# Patient Record
Sex: Female | Born: 1935 | Race: White | Hispanic: No | Smoking: Never smoker
Health system: Southern US, Community
[De-identification: ages and names within clinical notes are randomized; demographics above are authoritative.]

## PROBLEM LIST (undated history)

## (undated) DIAGNOSIS — I10 Essential (primary) hypertension: Secondary | ICD-10-CM

## (undated) DIAGNOSIS — R42 Dizziness and giddiness: Secondary | ICD-10-CM

## (undated) DIAGNOSIS — E7800 Pure hypercholesterolemia, unspecified: Secondary | ICD-10-CM

## (undated) DIAGNOSIS — G459 Transient cerebral ischemic attack, unspecified: Secondary | ICD-10-CM

## (undated) DIAGNOSIS — E079 Disorder of thyroid, unspecified: Secondary | ICD-10-CM

---

## 2015-06-03 ENCOUNTER — Emergency Department (HOSPITAL_COMMUNITY): Payer: Medicare Other

## 2015-06-03 ENCOUNTER — Observation Stay (HOSPITAL_COMMUNITY): Payer: Medicare Other

## 2015-06-03 ENCOUNTER — Encounter (HOSPITAL_COMMUNITY): Payer: Self-pay | Admitting: Emergency Medicine

## 2015-06-03 ENCOUNTER — Inpatient Hospital Stay (HOSPITAL_COMMUNITY)
Admission: EM | Admit: 2015-06-03 | Discharge: 2015-06-07 | DRG: 101 | Disposition: A | Discharge status: Skilled Nursing Facility | Payer: Medicare Other | Attending: Internal Medicine | Admitting: Internal Medicine

## 2015-06-03 DIAGNOSIS — R471 Dysarthria and anarthria: Secondary | ICD-10-CM | POA: Diagnosis present

## 2015-06-03 DIAGNOSIS — Z880 Allergy status to penicillin: Secondary | ICD-10-CM

## 2015-06-03 DIAGNOSIS — Z66 Do not resuscitate: Secondary | ICD-10-CM | POA: Diagnosis present

## 2015-06-03 DIAGNOSIS — Z139 Encounter for screening, unspecified: Secondary | ICD-10-CM

## 2015-06-03 DIAGNOSIS — Z7902 Long term (current) use of antithrombotics/antiplatelets: Secondary | ICD-10-CM

## 2015-06-03 DIAGNOSIS — R569 Unspecified convulsions: Secondary | ICD-10-CM | POA: Diagnosis not present

## 2015-06-03 DIAGNOSIS — G8194 Hemiplegia, unspecified affecting left nondominant side: Secondary | ICD-10-CM | POA: Diagnosis present

## 2015-06-03 DIAGNOSIS — I10 Essential (primary) hypertension: Secondary | ICD-10-CM | POA: Diagnosis present

## 2015-06-03 DIAGNOSIS — E079 Disorder of thyroid, unspecified: Secondary | ICD-10-CM | POA: Diagnosis not present

## 2015-06-03 DIAGNOSIS — E7800 Pure hypercholesterolemia, unspecified: Secondary | ICD-10-CM | POA: Diagnosis present

## 2015-06-03 DIAGNOSIS — N179 Acute kidney failure, unspecified: Secondary | ICD-10-CM | POA: Diagnosis not present

## 2015-06-03 DIAGNOSIS — I639 Cerebral infarction, unspecified: Secondary | ICD-10-CM | POA: Insufficient documentation

## 2015-06-03 DIAGNOSIS — G4089 Other seizures: Principal | ICD-10-CM | POA: Diagnosis present

## 2015-06-03 DIAGNOSIS — M6289 Other specified disorders of muscle: Secondary | ICD-10-CM | POA: Diagnosis not present

## 2015-06-03 DIAGNOSIS — E039 Hypothyroidism, unspecified: Secondary | ICD-10-CM | POA: Diagnosis present

## 2015-06-03 DIAGNOSIS — R42 Dizziness and giddiness: Secondary | ICD-10-CM

## 2015-06-03 DIAGNOSIS — G459 Transient cerebral ischemic attack, unspecified: Secondary | ICD-10-CM | POA: Diagnosis present

## 2015-06-03 DIAGNOSIS — N3000 Acute cystitis without hematuria: Secondary | ICD-10-CM | POA: Insufficient documentation

## 2015-06-03 DIAGNOSIS — M1990 Unspecified osteoarthritis, unspecified site: Secondary | ICD-10-CM | POA: Diagnosis present

## 2015-06-03 DIAGNOSIS — R4701 Aphasia: Secondary | ICD-10-CM | POA: Diagnosis present

## 2015-06-03 DIAGNOSIS — Z79899 Other long term (current) drug therapy: Secondary | ICD-10-CM

## 2015-06-03 DIAGNOSIS — R4182 Altered mental status, unspecified: Secondary | ICD-10-CM | POA: Diagnosis not present

## 2015-06-03 DIAGNOSIS — R531 Weakness: Secondary | ICD-10-CM | POA: Insufficient documentation

## 2015-06-03 DIAGNOSIS — E785 Hyperlipidemia, unspecified: Secondary | ICD-10-CM | POA: Diagnosis present

## 2015-06-03 DIAGNOSIS — Z7982 Long term (current) use of aspirin: Secondary | ICD-10-CM

## 2015-06-03 HISTORY — DX: Pure hypercholesterolemia, unspecified: E78.00

## 2015-06-03 HISTORY — DX: Essential (primary) hypertension: I10

## 2015-06-03 HISTORY — DX: Disorder of thyroid, unspecified: E07.9

## 2015-06-03 HISTORY — DX: Transient cerebral ischemic attack, unspecified: G45.9

## 2015-06-03 HISTORY — DX: Dizziness and giddiness: R42

## 2015-06-03 LAB — CBC
HCT: 41 % (ref 36.0–46.0)
Hemoglobin: 13.8 g/dL (ref 12.0–15.0)
MCH: 33.3 pg (ref 26.0–34.0)
MCHC: 33.7 g/dL (ref 30.0–36.0)
MCV: 99 fL (ref 78.0–100.0)
PLATELETS: 170 10*3/uL (ref 150–400)
RBC: 4.14 MIL/uL (ref 3.87–5.11)
RDW: 13.6 % (ref 11.5–15.5)
WBC: 7.9 10*3/uL (ref 4.0–10.5)

## 2015-06-03 LAB — PROTIME-INR
INR: 1.08 (ref 0.00–1.49)
PROTHROMBIN TIME: 14.2 s (ref 11.6–15.2)

## 2015-06-03 LAB — COMPREHENSIVE METABOLIC PANEL
ALBUMIN: 3.5 g/dL (ref 3.5–5.0)
ALT: 16 U/L (ref 14–54)
ANION GAP: 12 (ref 5–15)
AST: 18 U/L (ref 15–41)
Alkaline Phosphatase: 57 U/L (ref 38–126)
BILIRUBIN TOTAL: 1.5 mg/dL — AB (ref 0.3–1.2)
BUN: 28 mg/dL — ABNORMAL HIGH (ref 6–20)
CO2: 25 mmol/L (ref 22–32)
Calcium: 9.5 mg/dL (ref 8.9–10.3)
Chloride: 106 mmol/L (ref 101–111)
Creatinine, Ser: 1.39 mg/dL — ABNORMAL HIGH (ref 0.44–1.00)
GFR calc Af Amer: 37 mL/min — ABNORMAL LOW (ref >60)
GFR calc non Af Amer: 32 mL/min — ABNORMAL LOW (ref >60)
GLUCOSE: 118 mg/dL — AB (ref 65–99)
POTASSIUM: 4.2 mmol/L (ref 3.5–5.1)
SODIUM: 143 mmol/L (ref 135–145)
TOTAL PROTEIN: 6.6 g/dL (ref 6.5–8.1)

## 2015-06-03 LAB — URINALYSIS, ROUTINE W REFLEX MICROSCOPIC
Bilirubin Urine: NEGATIVE
Glucose, UA: 100 mg/dL — AB
Hgb urine dipstick: NEGATIVE
Ketones, ur: NEGATIVE mg/dL
LEUKOCYTES UA: NEGATIVE
NITRITE: POSITIVE — AB
PH: 6.5 (ref 5.0–8.0)
Protein, ur: NEGATIVE mg/dL
SPECIFIC GRAVITY, URINE: 1.014 (ref 1.005–1.030)
UROBILINOGEN UA: 0.2 mg/dL (ref 0.0–1.0)

## 2015-06-03 LAB — I-STAT CHEM 8, ED
BUN: 31 mg/dL — ABNORMAL HIGH (ref 6–20)
CALCIUM ION: 1.26 mmol/L (ref 1.13–1.30)
CHLORIDE: 106 mmol/L (ref 101–111)
Creatinine, Ser: 1.4 mg/dL — ABNORMAL HIGH (ref 0.44–1.00)
Glucose, Bld: 108 mg/dL — ABNORMAL HIGH (ref 65–99)
HCT: 43 % (ref 36.0–46.0)
HEMOGLOBIN: 14.6 g/dL (ref 12.0–15.0)
Potassium: 4 mmol/L (ref 3.5–5.1)
SODIUM: 142 mmol/L (ref 135–145)
TCO2: 22 mmol/L (ref 0–100)

## 2015-06-03 LAB — DIFFERENTIAL
Basophils Absolute: 0 10*3/uL (ref 0.0–0.1)
Basophils Relative: 0 %
EOS ABS: 0.2 10*3/uL (ref 0.0–0.7)
EOS PCT: 3 %
Lymphocytes Relative: 22 %
Lymphs Abs: 1.7 10*3/uL (ref 0.7–4.0)
Monocytes Absolute: 0.6 10*3/uL (ref 0.1–1.0)
Monocytes Relative: 8 %
NEUTROS PCT: 67 %
Neutro Abs: 5.3 10*3/uL (ref 1.7–7.7)

## 2015-06-03 LAB — I-STAT TROPONIN, ED: Troponin i, poc: 0.01 ng/mL (ref 0.00–0.08)

## 2015-06-03 LAB — URINE MICROSCOPIC-ADD ON

## 2015-06-03 LAB — RAPID URINE DRUG SCREEN, HOSP PERFORMED
Amphetamines: NOT DETECTED
Barbiturates: NOT DETECTED
Benzodiazepines: NOT DETECTED
COCAINE: NOT DETECTED
OPIATES: NOT DETECTED
TETRAHYDROCANNABINOL: NOT DETECTED

## 2015-06-03 LAB — LIPID PANEL
CHOL/HDL RATIO: 2 ratio
CHOLESTEROL: 179 mg/dL (ref 0–200)
HDL: 90 mg/dL (ref >40)
LDL CALC: 74 mg/dL (ref 0–99)
TRIGLYCERIDES: 75 mg/dL (ref <150)
VLDL: 15 mg/dL (ref 0–40)

## 2015-06-03 LAB — APTT: aPTT: 24 seconds (ref 24–37)

## 2015-06-03 LAB — ETHANOL

## 2015-06-03 LAB — CBG MONITORING, ED: GLUCOSE-CAPILLARY: 99 mg/dL (ref 65–99)

## 2015-06-03 MED ORDER — PHENYTOIN SODIUM 50 MG/ML IJ SOLN
1000.0000 mg | Freq: Once | INTRAMUSCULAR | Status: AC
  Administered 2015-06-03: 1000 mg via INTRAVENOUS
  Filled 2015-06-03: qty 20

## 2015-06-03 MED ORDER — WHITE PETROLATUM GEL
Status: AC
  Administered 2015-06-03: 22:00:00
  Filled 2015-06-03: qty 1

## 2015-06-03 MED ORDER — LEVOTHYROXINE SODIUM 50 MCG PO TABS
50.0000 ug | ORAL_TABLET | Freq: Every day | ORAL | Status: DC
  Administered 2015-06-04 – 2015-06-06 (×3): 50 ug via ORAL
  Filled 2015-06-03 (×4): qty 1

## 2015-06-03 MED ORDER — STROKE: EARLY STAGES OF RECOVERY BOOK
Freq: Once | Status: AC
  Administered 2015-06-03: 19:00:00

## 2015-06-03 MED ORDER — WHITE PETROLATUM GEL
Status: AC
  Administered 2015-06-03: 20:00:00
  Filled 2015-06-03: qty 1

## 2015-06-03 MED ORDER — ACETAMINOPHEN 500 MG PO TABS
500.0000 mg | ORAL_TABLET | Freq: Two times a day (BID) | ORAL | Status: DC
  Administered 2015-06-03 – 2015-06-06 (×6): 500 mg via ORAL
  Filled 2015-06-03 (×6): qty 1

## 2015-06-03 MED ORDER — PHENYTOIN SODIUM 50 MG/ML IJ SOLN
100.0000 mg | Freq: Three times a day (TID) | INTRAMUSCULAR | Status: DC
  Administered 2015-06-03 – 2015-06-04 (×4): 100 mg via INTRAVENOUS
  Filled 2015-06-03 (×6): qty 2

## 2015-06-03 MED ORDER — PHENYTOIN SODIUM 50 MG/ML IJ SOLN
100.0000 mg | Freq: Three times a day (TID) | INTRAMUSCULAR | Status: DC
  Filled 2015-06-03: qty 2

## 2015-06-03 MED ORDER — ONDANSETRON HCL 4 MG/2ML IJ SOLN
4.0000 mg | Freq: Once | INTRAMUSCULAR | Status: AC
  Administered 2015-06-03: 4 mg via INTRAVENOUS

## 2015-06-03 MED ORDER — MECLIZINE HCL 12.5 MG PO TABS: 25.0000 mg | ORAL_TABLET | Freq: Three times a day (TID) | ORAL | Status: DC | PRN

## 2015-06-03 MED ORDER — ONDANSETRON HCL 4 MG/2ML IJ SOLN
INTRAMUSCULAR | Status: AC
  Administered 2015-06-03: 4 mg via INTRAVENOUS
  Filled 2015-06-03: qty 2

## 2015-06-03 MED ORDER — ONDANSETRON HCL 4 MG/2ML IJ SOLN
4.0000 mg | Freq: Once | INTRAMUSCULAR | Status: AC
  Administered 2015-06-03: 4 mg via INTRAVENOUS
  Filled 2015-06-03: qty 2

## 2015-06-03 MED ORDER — LORAZEPAM 2 MG/ML IJ SOLN
1.0000 mg | Freq: Once | INTRAMUSCULAR | Status: AC
  Administered 2015-06-03: 0.5 mg via INTRAVENOUS

## 2015-06-03 MED ORDER — ASPIRIN EC 81 MG PO TBEC
81.0000 mg | DELAYED_RELEASE_TABLET | Freq: Every day | ORAL | Status: DC
  Administered 2015-06-03 – 2015-06-06 (×4): 81 mg via ORAL
  Filled 2015-06-03 (×4): qty 1

## 2015-06-03 MED ORDER — SENNOSIDES-DOCUSATE SODIUM 8.6-50 MG PO TABS: 1.0000 | ORAL_TABLET | Freq: Every evening | ORAL | Status: DC | PRN

## 2015-06-03 MED ORDER — ATORVASTATIN CALCIUM 10 MG PO TABS
20.0000 mg | ORAL_TABLET | Freq: Every day | ORAL | Status: DC
  Administered 2015-06-03 – 2015-06-06 (×4): 20 mg via ORAL
  Filled 2015-06-03 (×6): qty 2

## 2015-06-03 MED ORDER — CLOPIDOGREL BISULFATE 75 MG PO TABS
75.0000 mg | ORAL_TABLET | Freq: Every day | ORAL | Status: DC
  Administered 2015-06-03 – 2015-06-06 (×4): 75 mg via ORAL
  Filled 2015-06-03 (×4): qty 1

## 2015-06-03 MED ORDER — LORAZEPAM 2 MG/ML IJ SOLN
INTRAMUSCULAR | Status: AC
  Administered 2015-06-03: 0.5 mg via INTRAVENOUS
  Filled 2015-06-03: qty 1

## 2015-06-03 NOTE — ED Provider Notes (Signed)
CSN: [id number]     Arrival date & time 06/03/15  0946 History   First MD Initiated Contact with Patient 06/03/15 1015     Chief Complaint  Patient presents with  . Aphasia  . Extremity Weakness     (Consider location/radiation/quality/duration/timing/severity/associated sxs/prior Treatment) HPI   Yvette Guerrero is a 81 y.o. female who presents for evaluation of weakness and start speech, which was noted this morning. The patient [place] from [place], [place]. She is DO NOT RESUSCITATE, and usually walks with a walker. This morning she was unable to walk, and unable to use her left arm. This is unusual. She did not take any of her morning medications. She is unable to give history.  Level V caveat- altered mental status.    Past Medical History  Diagnosis Date  . Hypertension   . Vertigo   . TIA (transient ischemic attack)   . Elevated cholesterol   . Thyroid disease    History reviewed. No pertinent past surgical history. No family history on file. Social History  Substance Use Topics  . Smoking status: Never Smoker   . Smokeless tobacco: None  . Alcohol Use: No   OB History    No data available     Review of Systems  Unable to perform ROS: Mental status change      Allergies  Penicillins  Home Medications   Prior to Admission medications   Medication Sig Start Date End Date Taking? Authorizing Provider  acetaminophen (TYLENOL) 500 MG tablet Take 500 mg by mouth 2 (two) times daily.   Yes Historical Provider, MD  amLODipine (NORVASC) 5 MG tablet Take 5 mg by mouth daily.   Yes Historical Provider, MD  aspirin EC 81 MG tablet Take 81 mg by mouth daily.   Yes Historical Provider, MD  atenolol (TENORMIN) 50 MG tablet Take 50 mg by mouth daily.   Yes Historical Provider, MD  atorvastatin (LIPITOR) 20 MG tablet Take 20 mg by mouth daily.   Yes Historical Provider, MD  Calcium Carbonate (CALCIUM 600 PO) Take 1 tablet by mouth daily.   Yes Historical Provider, MD   clopidogrel (PLAVIX) 75 MG tablet Take 75 mg by mouth daily.   Yes Historical Provider, MD  levothyroxine (SYNTHROID, LEVOTHROID) 50 MCG tablet Take 50 mcg by mouth daily before breakfast.   Yes Historical Provider, MD  meclizine (ANTIVERT) 25 MG tablet Take 25 mg by mouth 3 (three) times daily as needed for dizziness.   Yes Historical Provider, MD  omega-3 acid ethyl esters (LOVAZA) 1 G capsule Take 1 g by mouth daily.   Yes Historical Provider, MD  Vitamin D, Cholecalciferol, 1000 UNITS TABS Take 1 tablet by mouth daily.    Yes Historical Provider, MD   BP 148/82 mmHg  Pulse 80  Temp(Src) 97.9 F (36.6 C) (Oral)  Resp 23  Ht 6\' 6"  (1.981 m)  Wt 160 lb (72.576 kg)  BMI 18.49 kg/m2  SpO2 98% Physical Exam  Constitutional: She is oriented to person, place, and time. She appears well-developed.  Elderly, frail  HENT:  Head: Normocephalic and atraumatic.  Right Ear: External ear normal.  Left Ear: External ear normal.  Eyes: Conjunctivae and EOM are normal. Pupils are equal, round, and reactive to light.  Neck: Normal range of motion and phonation normal. Neck supple.  Cardiovascular: Normal rate, regular rhythm and normal heart sounds.   Pulmonary/Chest: Effort normal and breath sounds normal. She exhibits no bony tenderness.  Abdominal: Soft. There is  no tenderness.  Musculoskeletal: Normal range of motion.  Neurological: She is alert and oriented to person, place, and time. No cranial nerve deficit or sensory deficit. She exhibits normal muscle tone. Coordination normal.  Flaccid left arm. Possible mild left facial droop. Dysarthria is present. Weak and clumsy left lower leg.  Skin: Skin is warm, dry and intact.  Psychiatric: She has a normal mood and affect. Her behavior is normal.  Nursing note and vitals reviewed.   ED Course  Procedures (including critical care time)  10:30- Consideration for thrombolysis- thrombolysis not given, because onset of symptom unknown, noticed  at wake up this morning and is beginning to resolve.  10:31- patient with left sided intermittent jaw clenching and twitching of the left shoulder girdle. This was associated with some decreased in her cognition. She is treated for seizure, with 1/2 mg of Ativan and also treated with Zofran for vomiting.  10:44- nursing states that seizure activity, improved, patient sleepy, but then activity recurred. As I arrived into the room, the patient's daughter states the shaking stopped. She then began twitching somewhat in the left shoulder girdle again. At this time; the patient is obtunded from the Ativan. Oxygen saturation with oxygen supplementation is normal. IV Dilantin ordered for possible status epilepticus.  10:50- case discussed with neuro hospitalist, Dr. Barnie Alderman, who will evaluate the patient.    Medications  phenytoin (DILANTIN) injection 100 mg (100 mg Intravenous Given 06/03/15 1401)  phenytoin (DILANTIN) injection 100 mg (not administered)  LORazepam (ATIVAN) injection 1 mg (0.5 mg Intravenous Given 06/03/15 1032)  ondansetron (ZOFRAN) injection 4 mg (4 mg Intravenous Given 06/03/15 1034)  phenytoin (DILANTIN) 1,000 mg in sodium chloride 0.9 % 250 mL IVPB (0 mg Intravenous Stopped 06/03/15 1158)  ondansetron (ZOFRAN) injection 4 mg (4 mg Intravenous Given 06/03/15 1322)    Patient Vitals for the past 24 hrs:  BP Temp Temp src Pulse Resp SpO2 Height Weight  06/03/15 1606 148/82 mmHg - - 80 23 98 % - -  06/03/15 1545 111/94 mmHg - - 76 17 98 % - -  06/03/15 1530 139/63 mmHg - - 80 20 98 % - -  06/03/15 1515 134/63 mmHg - - 83 20 96 % - -  06/03/15 1415 161/69 mmHg - - 78 16 97 % - -  06/03/15 1400 156/84 mmHg - - 73 17 97 % - -  06/03/15 1345 155/68 mmHg - - 75 18 95 % - -  06/03/15 1330 154/73 mmHg - - 71 21 96 % - -  06/03/15 1315 155/71 mmHg - - 79 17 96 % - -  06/03/15 1300 146/65 mmHg - - 75 21 94 % - -  06/03/15 1245 143/61 mmHg - - 75 23 91 % - -  06/03/15 1230 131/58 mmHg - -  68 18 96 % - -  06/03/15 1215 111/65 mmHg - - 66 16 96 % - -  06/03/15 1200 118/57 mmHg - - 66 24 93 % - -  06/03/15 1130 (!) 137/102 mmHg - - 81 22 100 % - -  06/03/15 1101 134/95 mmHg - - 80 23 98 % - -  06/03/15 1100 - - - 79 22 98 % - -  06/03/15 1055 148/69 mmHg - - 77 15 97 % - -  06/03/15 1045 147/71 mmHg - - 79 20 98 % - -  06/03/15 1040 142/58 mmHg - - 80 23 96 % - -  06/03/15 1030 114/55 mmHg - - 83  22 97 % - -  06/03/15 1015 170/80 mmHg - - 77 20 96 % - -  06/03/15 1004 163/69 mmHg 97.9 F (36.6 C) Oral 77 16 90 %  (1.981 m) 160 lb (72.576 kg)  06/03/15 0958 - - - - - 94 % - -    4:10 PM Reevaluation with update and discussion. After initial assessment and treatment, an updated evaluation reveals she is wide awake, alert, and comfortable. She is moving her left arm, with strength 4 over 5. He is able to elevate the left leg off the stretcher easily.Mancel Bale L   4:15 PM-Consult complete with hospitalist. Patient case explained and discussed. She agrees to admit patient for further evaluation and treatment. Call ended at 17:00  Medications  phenytoin (DILANTIN) injection 100 mg (100 mg Intravenous Given 06/03/15 1401)  phenytoin (DILANTIN) injection 100 mg (not administered)  LORazepam (ATIVAN) injection 1 mg (0.5 mg Intravenous Given 06/03/15 1032)  ondansetron (ZOFRAN) injection 4 mg (4 mg Intravenous Given 06/03/15 1034)  phenytoin (DILANTIN) 1,000 mg in sodium chloride 0.9 % 250 mL IVPB (0 mg Intravenous Stopped 06/03/15 1158)  ondansetron (ZOFRAN) injection 4 mg (4 mg Intravenous Given 06/03/15 1322)       CRITICAL CARE Performed by: Mancel Bale L Total critical care time: 55 minutes minutes Critical care time was exclusive of separately billable procedures and treating other patients. Critical care was necessary to treat or prevent imminent or life-threatening deterioration. Critical care was time spent personally by me on the following activities:  development of treatment plan with patient and/or surrogate as well as nursing, discussions with consultants, evaluation of patient's response to treatment, examination of patient, obtaining history from patient or surrogate, ordering and performing treatments and interventions, ordering and review of laboratory studies, ordering and review of radiographic studies, pulse oximetry and re-evaluation of patient's condition.    Labs Review Labs Reviewed  COMPREHENSIVE METABOLIC PANEL - Abnormal; Notable for the following:    Glucose, Bld 118 (*)    BUN 28 (*)    Creatinine, Ser 1.39 (*)    Total Bilirubin 1.5 (*)    GFR calc non Af Amer 32 (*)    GFR calc Af Amer 37 (*)    All other components within normal limits  URINALYSIS, ROUTINE W REFLEX MICROSCOPIC (NOT AT [facility]) - Abnormal; Notable for the following:    APPearance CLOUDY (*)    Glucose, UA 100 (*)    Nitrite POSITIVE (*)    All other components within normal limits  URINE MICROSCOPIC-ADD ON - Abnormal; Notable for the following:    Bacteria, UA MANY (*)    All other components within normal limits  I-STAT CHEM 8, ED - Abnormal; Notable for the following:    BUN 31 (*)    Creatinine, Ser 1.40 (*)    Glucose, Bld 108 (*)    All other components within normal limits  PROTIME-INR  APTT  CBC  DIFFERENTIAL  ETHANOL  URINE RAPID DRUG SCREEN, HOSP PERFORMED  HEMOGLOBIN A1C  I-STAT TROPOININ, ED  CBG MONITORING, ED  I-STAT CHEM 8, ED  I-STAT TROPOININ, ED    Imaging Review Ct Head Wo Contrast  06/03/2015  CLINICAL DATA:  81 year old with prior strokes who presents with acute onset of left upper and lower extremity weakness and slurred speech witnessed by her granddaughter earlier today. EXAM: CT HEAD WITHOUT CONTRAST TECHNIQUE: Contiguous axial images were obtained from the base of the skull through the vertex without intravenous contrast. COMPARISON:  None. FINDINGS: Encephalomalacia involving both occipital lobes, right greater  than left, and the right frontal lobe. Severe changes of small vessel disease of the white matter, particularly in the basal ganglia bilaterally and in the periventricular white matter. Physiologic calcifications in the basal ganglia. Ventricular system normal in size and appearance for age. Moderate generalized age related atrophy. No mass lesion. No midline shift. No acute hemorrhage or hematoma. No extra-axial fluid collections. No evidence of acute infarction. No skull fracture or other focal osseous abnormality involving the skull. Visualized paranasal sinuses, bilateral mastoid air cells and bilateral middle ear cavities well-aerated. Extensive bilateral carotid siphon and vertebral artery atherosclerosis. IMPRESSION: 1. No acute intracranial abnormality. 2. Old cortical stroke involving both occipital lobes (right greater than left) and the right frontal lobe. 3. Moderate generalized age related atrophy and severe chronic microvascular ischemic changes of the white matter. Electronically Signed   By: Hulan Saas M.D.   On: 06/03/2015 11:58   I have personally reviewed and evaluated these images and lab results as part of my medical decision-making.   EKG Interpretation   Date/Time:  Tuesday June 03 2015 11:56:51 EST Ventricular Rate:  66 PR Interval:  182 QRS Duration: 85 QT Interval:  427 QTC Calculation: 447 R Axis:   8 Text Interpretation:  Sinus rhythm Low voltage, precordial leads Since  last tracing of earlier today No significant change was found Confirmed by  Effie Shy  MD, Theresea Trautmann [phone]) on 06/03/2015 12:04:01 PM         EKG Interpretation  Date/Time:  Tuesday June 03 2015 11:56:51 EST Ventricular Rate:  66 PR Interval:  182 QRS Duration: 85 QT Interval:  427 QTC Calculation: 447 R Axis:   8 Text Interpretation:  Sinus rhythm Low voltage, precordial leads Since last tracing of earlier today No significant change was found Confirmed by Effie Shy  MD, Jaelie Aguilera ([id number])  on 06/03/2015 12:04:01 PM       MDM   Final diagnoses:  Seizure (HCC)  Cerebral infarction due to unspecified mechanism    Altered mental status with left-sided weakness, consistent with CVA. Seizure, which seemed to occur after CVA. Patient had improvement in the ED. EEG results pending, at time of disposition.  Nursing Notes Reviewed/ Care Coordinated, and agree without changes. Applicable Imaging Reviewed.  Interpretation of Laboratory Data incorporated into ED treatment  Plan: Admit  Mancel Bale, MD 06/04/15 [phone]

## 2015-06-03 NOTE — ED Notes (Signed)
EKG completed given to EDP.  

## 2015-06-03 NOTE — ED Notes (Signed)
Doctor said hold CT until Dilantin is started.

## 2015-06-03 NOTE — ED Notes (Signed)
Attempted report 

## 2015-06-03 NOTE — ED Notes (Signed)
Neurology at bedside.

## 2015-06-03 NOTE — ED Notes (Signed)
Called pharmacy for Dilantin said will be sending medication as soon as possible.

## 2015-06-03 NOTE — Progress Notes (Signed)
Bedside EEG completed, results pending. 

## 2015-06-03 NOTE — ED Notes (Signed)
Witnessed by grand daugher ambulated as normal with walker went to sleep at 8pm and work up today 0745 today woke up witnessed by grand daughter hand grasps weaker left hand unable to pick up left arm or leg with slurred speech.  Overtime left side weakness improving and still has slurred speech.

## 2015-06-03 NOTE — Procedures (Signed)
ELECTROENCEPHALOGRAM REPORT   Patient: Yvette Guerrero       Room #: E37 EEG No. ID: [id number] Age: 81 y.o.        Sex: female Referring Physician: Effie ShyWentz Report Date:  06/03/2015        Interpreting Physician: Thana FarrEYNOLDS, Jasir Rother  History: Yvette BeamMary Armendarez is an 81 y.o. female presenting with left sided weakness and slurred speech who while in ED developed involuntary movements suggestive of seizure  Medications:  Scheduled: . phenytoin (DILANTIN) IV  100 mg Intravenous 3 times per day  . [START ON 06/04/2015] phenytoin (DILANTIN) IV  100 mg Intravenous 3 times per day    Conditions of Recording:  This is a 16 channel EEG carried out with the patient in the drowsy and asleep state.  EEG performed after administration of Ativan.  Description:  The background activity is slow and consists of a mixture of low voltage theta and delta activity.  This activity is continuous and poorly organized.    The patient goes in to a light sleep with symmetrical sleep spindles, vertex central sharp transients and irregular slow activity.  No epileptiform activity is noted.  No focal involuntary movements are noted.   Hyperventilation and intermittent photic stimulation were not performed.   IMPRESSION: This is a normal drowsy and asleep electroencephalogram.  No epileptiform activity is noted.    Thana FarrLeslie Laverle Pillard, MD [facility] [phone] 06/03/2015, 5:31 PM

## 2015-06-03 NOTE — Consult Note (Addendum)
Referring Physician: ED    Chief Complaint: left sided weakness, dysarthria, left focal seizure  HPI:                                                                                                                                         Yvette Guerrero is an 81 y.o. female, right handed, with a past medical history significant for HTN, HLD, TIA, thyroid disease, brought in by family for evaluation of the above stated symptoms. Daughter is at the bedside and tells me that at baseline Yvette Guerrero lives with her daughter, is very functional, no concerns with cognitive dysfunction. She went to bed around 8 pm last night feeling well, and woke up around 7:45 am with slurred speech and weakness of the left arm and leg.  Upon arrival to the ED she was noted to have intermittent jerking movements of the left face-arm-leg without alteration of consciousness and was administered 1 mg IV ativan as well as loading dose 1 gram IV dilantin de to concern for a left focal motor seizure. CT brain pending. Reviewed serologies: etoh level < 5, UDS pending, Na 143,  Ca 9.5, Cr 1.39. Family said that patient never complains about anything but they report no recent fall, head trauma, fever, infection.   Day last known well: 06/02/15 Time last known well: 8 pm tPA Given: no, out of the window, focal motor seizure with this presentastion   Past Medical History  Diagnosis Date  . Hypertension   . Vertigo   . TIA (transient ischemic attack)   . Elevated cholesterol   . Thyroid disease     History reviewed. No pertinent past surgical history.  No family history on file. Social History:  reports that she has never smoked. She does not have any smokeless tobacco history on file. She reports that she does not drink alcohol or use illicit drugs.  Allergies:  Allergies  Allergen Reactions  . Penicillins     Has patient had a PCN reaction causing immediate rash, facial/tongue/throat swelling, SOB or lightheadedness  with hypotension: No Has patient had a PCN reaction causing severe rash involving mucus membranes or skin necrosis: No Has patient had a PCN reaction that required hospitalization No Has patient had a PCN reaction occurring within the last 10 years: No If all of the above answers are "NO", then may proceed with Cephalosporin use.    Medications:  I have reviewed the patient's current medications.  ROS: unable to obtain due to mental status                                                                                                                                      History obtained from family and chart review    Physical exam:  Constitutional: well developed, no apparent distress. Blood pressure 163/69, pulse 77, temperature 97.9 F (36.6 C), temperature source Oral, resp. rate 16, height _0  (1.981 m), weight 72.576 kg (160 lb), SpO2 90 %. Eyes: no jaundice or exophthalmos.  Head: normocephalic. Neck: supple, no bruits, no JVD. Cardiac: no murmurs. Lungs: clear. Abdomen: soft, no tender, no mass. Extremities: no edema, clubbing, or cyanosis.  Skin: no rash  Neurologic Examination:                                                                                                      General: NAD Mental Status: Drowsy s/p IV ativan. Comprehension intact. Spontaneous language is fluent but some difficulty naming objects.  Cranial Nerves: II: Discs flat bilaterally; Visual fields grossly normal, pupils equal, round, reactive to light and accommodation III,IV, VI: ptosis not present, extra-ocular motions intact bilaterally V,VII: smile questionable asymmetric due to left lower face weakness, facial light touch sensation normal bilaterally VIII: hearing normal bilaterally IX,X: uvula rises symmetrically XI: bilateral shoulder shrug no tested XII: midline  tongue extension without atrophy or fasciculations Motor: Significant for weakness left arm>left leg. Tone and bulk:normal tone throughout; no atrophy noted Sensory: Pinprick and light touch intact throughout, bilaterally Deep Tendon Reflexes:  1 all over Plantars: Right: downgoing   Left: downgoing Cerebellar: Unable to test due to mental status, ongoing seizure. Gait:  Unable to test due to multiple leads    Results for orders placed or performed during the hospital encounter of 06/03/15 (from the past 48 hour(s))  Protime-INR     Status: None   Collection Time: 06/03/15 10:00 AM  Result Value Ref Range   Prothrombin Time 14.2 11.6 - 15.2 seconds   INR 1.08 0.00 - 1.49  APTT     Status: None   Collection Time: 06/03/15 10:00 AM  Result Value Ref Range   aPTT 24 24 - 37 seconds  CBC     Status: None   Collection Time: 06/03/15 10:00 AM  Result Value Ref Range   WBC 7.9 4.0 - 10.5 K/uL   RBC 4.14 3.87 - 5.11 MIL/uL  Hemoglobin 13.8 12.0 - 15.0 g/dL   HCT 41.0 36.0 - 46.0 %   MCV 99.0 78.0 - 100.0 fL   MCH 33.3 26.0 - 34.0 pg   MCHC 33.7 30.0 - 36.0 g/dL   RDW 13.6 11.5 - 15.5 %   Platelets 170 150 - 400 K/uL  Differential     Status: None   Collection Time: 06/03/15 10:00 AM  Result Value Ref Range   Neutrophils Relative % 67 %   Neutro Abs 5.3 1.7 - 7.7 K/uL   Lymphocytes Relative 22 %   Lymphs Abs 1.7 0.7 - 4.0 K/uL   Monocytes Relative 8 %   Monocytes Absolute 0.6 0.1 - 1.0 K/uL   Eosinophils Relative 3 %   Eosinophils Absolute 0.2 0.0 - 0.7 K/uL   Basophils Relative 0 %   Basophils Absolute 0.0 0.0 - 0.1 K/uL  Comprehensive metabolic panel     Status: Abnormal   Collection Time: 06/03/15 10:00 AM  Result Value Ref Range   Sodium 143 135 - 145 mmol/L   Potassium 4.2 3.5 - 5.1 mmol/L   Chloride 106 101 - 111 mmol/L   CO2 25 22 - 32 mmol/L   Glucose, Bld 118 (H) 65 - 99 mg/dL   BUN 28 (H) 6 - 20 mg/dL   Creatinine, Ser 1.39 (H) 0.44 - 1.00 mg/dL    Calcium 9.5 8.9 - 10.3 mg/dL   Total Protein 6.6 6.5 - 8.1 g/dL   Albumin 3.5 3.5 - 5.0 g/dL   AST 18 15 - 41 U/L   ALT 16 14 - 54 U/L   Alkaline Phosphatase 57 38 - 126 U/L   Total Bilirubin 1.5 (H) 0.3 - 1.2 mg/dL   GFR calc non Af Amer 32 (L) >60 mL/min   GFR calc Af Amer 37 (L) >60 mL/min    Comment: (NOTE) The eGFR has been calculated using the CKD EPI equation. This calculation has not been validated in all clinical situations. eGFR's persistently <60 mL/min signify possible Chronic Kidney Disease.    Anion gap 12 5 - 15  I-stat troponin, ED (not at [facility], [facility])     Status: None   Collection Time: 06/03/15 10:23 AM  Result Value Ref Range   Troponin i, poc 0.01 0.00 - 0.08 ng/mL   Comment 3            Comment: Due to the release kinetics of cTnI, a negative result within the first hours of the onset of symptoms does not rule out myocardial infarction with certainty. If myocardial infarction is still suspected, repeat the test at appropriate intervals.   I-Stat Chem 8, ED  (not at [facility], [facility])     Status: Abnormal   Collection Time: 06/03/15 10:25 AM  Result Value Ref Range   Sodium 142 135 - 145 mmol/L   Potassium 4.0 3.5 - 5.1 mmol/L   Chloride 106 101 - 111 mmol/L   BUN 31 (H) 6 - 20 mg/dL   Creatinine, Ser 1.40 (H) 0.44 - 1.00 mg/dL   Glucose, Bld 108 (H) 65 - 99 mg/dL   Calcium, Ion 1.26 1.13 - 1.30 mmol/L   TCO2 22 0 - 100 mmol/L   Hemoglobin 14.6 12.0 - 15.0 g/dL   HCT 43.0 36.0 - 46.0 %  CBG monitoring, ED     Status: None   Collection Time: 06/03/15 10:37 AM  Result Value Ref Range   Glucose-Capillary 99 65 - 99 mg/dL   No results found.  Assessment: 81 y.o. female with new onset left sided weakness arm leg and left focal motor seizures. She is drowsy s/p IV ativan but comprehension and spontaneous language are rather intact. Some trouble with naming. Differential includes right brain stroke with very early symptomatic left focal motor seizures versus  right brain mass with focal motor seizures. CT brain pending, but will need  MRI regardless of CT results. IV ativan PRN for focal motor seizure control. Continue IV dilantin 10 mg every 8 hours starting tomorrow. Dilantin level in am. EEG Will follow up.  Stroke Risk Factors - age, HTN, HLD, TIA  Plan: 1. HgbA1c, fasting lipid panel 2. MRI, MRA  of the brain without contrast 3. Echocardiogram 4. Carotid dopplers 5. Prophylactic therapy-aspirin after passing swallowing evaluation 6. Risk factor modification 7. Telemetry monitoring 8. Frequent neuro checks 9. PT/OT SLP 10. NPO 11. EEG  Dorian Pod, MD [facility] [phone]  06/03/2015, 11:10 AM

## 2015-06-03 NOTE — ED Notes (Signed)
Mild tremors return Doctor notified and at bedside. Will order Dilantin. Not to given 0.5mg  ativan at this time.

## 2015-06-03 NOTE — ED Notes (Signed)
Doctor notified patient starting to have tremors left shoulder then body with nausea.

## 2015-06-03 NOTE — H&P (Signed)
[facility] History and Physical  Yvette Guerrero AOZ:[id number] DOB: May 14, 1920 DOA: 06/03/2015  Referring physician: Effie Shy PCP: No primary care provider on file. from out of town  Chief Complaint: left sided weakness  HPI: Yvette Guerrero is a 81 y.o. female the past medical history that includes hypertension, hyperlipidemia, TIA, thyroid disease presents to the emergency department with the chief complaint of sudden onset of left-sided weakness dysarthria. Initial evaluation in the emergency department includes neurology consult who recommended admission for stroke workup. Information is obtained from the granddaughter who is at the bedside. Patient is from [place] lives with her daughter came here last night for visit with the granddaughter. Her baseline is very functional fairly independent. Granddaughter reports she noticed this morning when the patient awakened around 7:45 AM she had slurred speech and obvious weakness of the left arm and leg. She was unable to bear weight. She noticed slight facial droop. He should have gone to bed at 8 PM the night before and had no symptoms. EMS was called transported to the emergency department during her presentation in the ED jerking movements of left arm and leg noted. There was no complaints of chest pain palpitation shortness of breath headache dizziness syncope or near-syncope. No reports of any visual disturbances. Patient went to bed normal and awakened with these symptoms.   Workup in the emergency department lids comprehensive metabolic panel with a creatinine of 1.39 otherwise unremarkable, CBC that is unremarkable troponin negative. CT of the head no acute intracranial abnormality. During neurology evaluation some question regarding seizure activity. She was given 1 mg of Ativan IV as well as 1 g of Dilantin IV due to concern for left focal motor seizure.  She is afebrile hemodynamically stable and not hypoxic. Review of Systems:  10 point  review of systems complete and all systems are negative except as indicated in the history of present illness  Past Medical History  Diagnosis Date  . Hypertension   . Vertigo   . TIA (transient ischemic attack)   . Elevated cholesterol   . Thyroid disease    History reviewed. No pertinent past surgical history. Social History:  reports that she has never smoked. She does not have any smokeless tobacco history on file. She reports that she does not drink alcohol or use illicit drugs.  Allergies  Allergen Reactions  . Penicillins     Has patient had a PCN reaction causing immediate rash, facial/tongue/throat swelling, SOB or lightheadedness with hypotension:  Has patient had a PCN reaction causing severe rash involving mucus membranes or skin necrosis:  Has patient had a PCN reaction that required hospitalization  Has patient had a PCN reaction occurring within the last 10 years:  If all of the above answers are "NO", then may proceed with Cephalosporin use.    No family history on file. medical history reviewed and noncontributory to the admission of this 81 year old lady  Prior to Admission medications   Medication Sig Start Date End Date Taking? Authorizing Provider  acetaminophen (TYLENOL) 500 MG tablet Take 500 mg by mouth 2 (two) times daily.   Yes Historical Provider, MD  amLODipine (NORVASC) 5 MG tablet Take 5 mg by mouth daily.   Yes Historical Provider, MD  aspirin EC 81 MG tablet Take 81 mg by mouth daily.   Yes Historical Provider, MD  atenolol (TENORMIN) 50 MG tablet Take 50 mg by mouth daily.   Yes Historical Provider, MD  atorvastatin (LIPITOR) 20 MG tablet Take 20 mg by  mouth daily.   Yes Historical Provider, MD  Calcium Carbonate (CALCIUM 600 PO) Take 1 tablet by mouth daily.   Yes Historical Provider, MD  clopidogrel (PLAVIX) 75 MG tablet Take 75 mg by mouth daily.   Yes Historical Provider, MD  levothyroxine (SYNTHROID, LEVOTHROID) 50 MCG tablet Take 50 mcg by  mouth daily before breakfast.   Yes Historical Provider, MD  meclizine (ANTIVERT) 25 MG tablet Take 25 mg by mouth 3 (three) times daily as needed for dizziness.   Yes Historical Provider, MD  omega-3 acid ethyl esters (LOVAZA) 1 G capsule Take 1 g by mouth daily.   Yes Historical Provider, MD  Vitamin D, Cholecalciferol, 1000 UNITS TABS Take 1 tablet by mouth daily.    Yes Historical Provider, MD   Physical Exam: Filed Vitals:   06/03/15 1530 06/03/15 1545 06/03/15 1606 06/03/15 1615  BP: 139/63 111/94 148/82 126/92  Pulse: 80 76 80 78  Temp:      TempSrc:      Resp: Height:      Weight:      SpO2: 98% 98% 98% 96%    Wt Readings from Last 3 Encounters:  06/03/15 72.576 kg (160 lb)    General:  Appears calm and comfortable Eyes: PERRL, normal lids, irises & conjunctiva ENT: grossly normal hearing, lips & tongue somewhat tongue deviated, tongue with bruising Neck: no LAD, masses or thyromegaly Cardiovascular: RRR, no m/r/g. trace LE edema. Venous stasis changes  Respiratory: CTA bilaterally, no w/r/r. Normal respiratory effort. Abdomen: soft, ntnd +BS Skin: no rash or induration seen on limited exam Musculoskeletal: grossly normal tone BUE/BLE Psychiatric: grossly normal mood and affect, speech fluent and appropriate Neurologic: left grip 2/5, left arm some gross ,motor at shoulder, right grip 4/5 right LE strength 5/5 left LE strength 4/5l slight facial droop. Speech slurred           Labs on Admission:  Basic Metabolic Panel:  Recent Labs Lab 06/03/15 1000 06/03/15 1025  NA 143 142  K 4.2 4.0  CL 106 106  CO2 25  --   GLUCOSE 118* 108*  BUN 28* 31*  CREATININE 1.39* 1.40*  CALCIUM 9.5  --    Liver Function Tests:  Recent Labs Lab 06/03/15 1000  AST 18  ALT 16  ALKPHOS 57  BILITOT 1.5*  PROT 6.6  ALBUMIN 3.5   No results for input(s): LIPASE, AMYLASE in the last 168 hours. No results for input(s): AMMONIA in the last 168  hours. CBC:  Recent Labs Lab 06/03/15 1000 06/03/15 1025  WBC 7.9  --   NEUTROABS 5.3  --   HGB 13.8 14.6  HCT 41.0 43.0  MCV 99.0  --   PLT 170  --    Cardiac Enzymes: No results for input(s): CKTOTAL, CKMB, CKMBINDEX, TROPONINI in the last 168 hours.  BNP (last 3 results) No results for input(s): BNP in the last 8760 hours.  ProBNP (last 3 results) No results for input(s): PROBNP in the last 8760 hours.  CBG:  Recent Labs Lab 06/03/15 1037  GLUCAP 99    Radiological Exams on Admission: Ct Head Wo Contrast  06/03/2015  CLINICAL DATA:  81 year old with prior strokes who presents with acute onset of left upper and lower extremity weakness and slurred speech witnessed by her granddaughter earlier today. EXAM: CT HEAD WITHOUT CONTRAST TECHNIQUE: Contiguous axial images were obtained from the base of the skull through the vertex without intravenous contrast. COMPARISON:  None. FINDINGS: Encephalomalacia involving both occipital lobes, right greater than left, and the right frontal lobe. Severe changes of small vessel disease of the white matter, particularly in the basal ganglia bilaterally and in the periventricular white matter. Physiologic calcifications in the basal ganglia. Ventricular system normal in size and appearance for age. Moderate generalized age related atrophy. No mass lesion. No midline shift. No acute hemorrhage or hematoma. No extra-axial fluid collections. No evidence of acute infarction. No skull fracture or other focal osseous abnormality involving the skull. Visualized paranasal sinuses, bilateral mastoid air cells and bilateral middle ear cavities well-aerated. Extensive bilateral carotid siphon and vertebral artery atherosclerosis. IMPRESSION: 1. No acute intracranial abnormality. 2. Old cortical stroke involving both occipital lobes (right greater than left) and the right frontal lobe. 3. Moderate generalized age related atrophy and severe chronic microvascular  ischemic changes of the white matter. Electronically Signed   By: Hulan Saashomas  Lawrence M.D.   On: 06/03/2015 11:58    EKG: Independently reviewed sinus rhythm.  Assessment/Plan Active Problems:   CVA (cerebral infarction)   Hypertension   Vertigo   Thyroid disease   Acute kidney injury (HCC)   Seizure (HCC)   #1. Obvious left-sided weakness with slurred speech and facial droop. Admit to telemetry. Obtain MRI/MRA 2-D echo carotid Dopplers. Will get hemoglobin A1c lipid panel provide aspirin. She was evaluated by neurology in the emergency department who recommended admission for stroke workup. Neurology note reflects differential includes right brain stroke with very early symptomatic left focal motor seizures versus right brain mass with focal motor seizures. CT as noted above. Await MRI results. This and also needs a bedside swallow eval and will be nothing by mouth until this happens. Prior to admission she was on aspirin and Plavix. Will continue  #2. Acute kidney injury. [place] therefore I don't have any lab data. Creatinine 1.3 on admission. Will gently hydrate hold any nephrotoxins monitor urine output and repeat in the morning.  #3. Questionable seizure activity. Given Ativan and Dilantin in the emergency department we'll continue Dilantin per neuro recommendation. EEG pending.  #4. Hypertension. Home medications include amlodipine and atenolol. Will hold these for now. Monitor closely and resume when indicated  #5. Thyroid disease. Will obtain a TSH. Continue her home Synthroid.    Code Status: full DVT Prophylaxis: Family Communication: granddaughter Disposition Plan: home with granddaughter  Time spent: 60 minutes  [facility] Name HospitalBLACK,KAREN M [facility]

## 2015-06-03 NOTE — Progress Notes (Signed)
Pt arrived to 5M19 via stretcher.  Pt alert and oriented.  Telemetry applied and CCMD notified.  VSS.  Will continue to monitor. Sondra ComeSilva, Mavery Milling M, RN

## 2015-06-03 NOTE — ED Notes (Signed)
EEG at bedside.

## 2015-06-03 NOTE — ED Notes (Signed)
Patient stopped having tremors.

## 2015-06-04 ENCOUNTER — Observation Stay (HOSPITAL_COMMUNITY): Payer: Medicare Other

## 2015-06-04 ENCOUNTER — Inpatient Hospital Stay (HOSPITAL_COMMUNITY): Payer: Medicare Other

## 2015-06-04 DIAGNOSIS — E079 Disorder of thyroid, unspecified: Secondary | ICD-10-CM | POA: Diagnosis present

## 2015-06-04 DIAGNOSIS — I639 Cerebral infarction, unspecified: Secondary | ICD-10-CM | POA: Diagnosis not present

## 2015-06-04 DIAGNOSIS — Z66 Do not resuscitate: Secondary | ICD-10-CM | POA: Diagnosis present

## 2015-06-04 DIAGNOSIS — I6789 Other cerebrovascular disease: Secondary | ICD-10-CM | POA: Diagnosis not present

## 2015-06-04 DIAGNOSIS — R569 Unspecified convulsions: Secondary | ICD-10-CM

## 2015-06-04 DIAGNOSIS — Z880 Allergy status to penicillin: Secondary | ICD-10-CM | POA: Diagnosis not present

## 2015-06-04 DIAGNOSIS — I10 Essential (primary) hypertension: Secondary | ICD-10-CM | POA: Diagnosis present

## 2015-06-04 DIAGNOSIS — E785 Hyperlipidemia, unspecified: Secondary | ICD-10-CM | POA: Diagnosis present

## 2015-06-04 DIAGNOSIS — R471 Dysarthria and anarthria: Secondary | ICD-10-CM | POA: Diagnosis present

## 2015-06-04 DIAGNOSIS — R4701 Aphasia: Secondary | ICD-10-CM | POA: Diagnosis present

## 2015-06-04 DIAGNOSIS — G8194 Hemiplegia, unspecified affecting left nondominant side: Secondary | ICD-10-CM | POA: Diagnosis present

## 2015-06-04 DIAGNOSIS — R4182 Altered mental status, unspecified: Secondary | ICD-10-CM | POA: Diagnosis present

## 2015-06-04 DIAGNOSIS — Z79899 Other long term (current) drug therapy: Secondary | ICD-10-CM | POA: Diagnosis not present

## 2015-06-04 DIAGNOSIS — N3000 Acute cystitis without hematuria: Secondary | ICD-10-CM | POA: Diagnosis present

## 2015-06-04 DIAGNOSIS — Z7982 Long term (current) use of aspirin: Secondary | ICD-10-CM | POA: Diagnosis not present

## 2015-06-04 DIAGNOSIS — Z7902 Long term (current) use of antithrombotics/antiplatelets: Secondary | ICD-10-CM | POA: Diagnosis not present

## 2015-06-04 DIAGNOSIS — G459 Transient cerebral ischemic attack, unspecified: Secondary | ICD-10-CM | POA: Diagnosis present

## 2015-06-04 DIAGNOSIS — N179 Acute kidney failure, unspecified: Secondary | ICD-10-CM | POA: Diagnosis present

## 2015-06-04 DIAGNOSIS — G458 Other transient cerebral ischemic attacks and related syndromes: Secondary | ICD-10-CM | POA: Diagnosis not present

## 2015-06-04 DIAGNOSIS — E7800 Pure hypercholesterolemia, unspecified: Secondary | ICD-10-CM | POA: Diagnosis present

## 2015-06-04 DIAGNOSIS — M1990 Unspecified osteoarthritis, unspecified site: Secondary | ICD-10-CM | POA: Diagnosis present

## 2015-06-04 DIAGNOSIS — E039 Hypothyroidism, unspecified: Secondary | ICD-10-CM | POA: Diagnosis present

## 2015-06-04 DIAGNOSIS — G4089 Other seizures: Secondary | ICD-10-CM | POA: Diagnosis present

## 2015-06-04 LAB — HEMOGLOBIN A1C
HEMOGLOBIN A1C: 5.7 % — AB (ref 4.8–5.6)
Hgb A1c MFr Bld: 5.7 % — ABNORMAL HIGH (ref 4.8–5.6)
Mean Plasma Glucose: 117 mg/dL
Mean Plasma Glucose: 117 mg/dL

## 2015-06-04 LAB — PHENYTOIN LEVEL, TOTAL: PHENYTOIN LVL: 15.1 ug/mL (ref 10.0–20.0)

## 2015-06-04 MED ORDER — LEVETIRACETAM 500 MG PO TABS
500.0000 mg | ORAL_TABLET | Freq: Two times a day (BID) | ORAL | Status: DC
  Administered 2015-06-04 – 2015-06-06 (×6): 500 mg via ORAL
  Filled 2015-06-04 (×6): qty 1

## 2015-06-04 MED ORDER — DEXTROSE 5 % IV SOLN
1.0000 g | INTRAVENOUS | Status: DC
  Administered 2015-06-04: 1 g via INTRAVENOUS
  Filled 2015-06-04 (×2): qty 10

## 2015-06-04 NOTE — Progress Notes (Signed)
VASCULAR LAB PRELIMINARY  PRELIMINARY  PRELIMINARY  PRELIMINARY  Carotid duplex  completed.    Preliminary report:  Bilateral:  1-39% ICA stenosis.  Vertebral artery flow is antegrade.      Zanaria Morell, RVT 06/04/2015, 3:15 PM

## 2015-06-04 NOTE — Progress Notes (Signed)
NEURO HOSPITALIST PROGRESS NOTE   SUBJECTIVE:                                                                                                                        Doing better this morning, resting in bed eating breakfast. Offers no neurological complains. Stated that her left arm and leg are substantially stronger. No further seizures noted. Dilantin level pending. UA positive for infection. MRI brain was personally reviewed and showed no acute abnormality. EEG normal.  OBJECTIVE:                                                                                                                           Vital signs in last 24 hours: Temp:  [97.9 F (36.6 C)-100.1 F (37.8 C)] 98.6 F (37 C) (11/09 0621) Pulse Rate:  [66-85] 80 (11/09 0621) Resp:  [15-24] 16 (11/09 0621) BP: (111-172)/(38-102) 127/58 mmHg (11/09 0621) SpO2:  [90 %-100 %] 95 % (11/09 0621) Weight:  [72.576 kg (160 lb)] 72.576 kg (160 lb) (11/08 1004)  Intake/Output from previous day:   Intake/Output this shift:   Nutritional status: Diet Heart Room service appropriate?: Yes; Fluid consistency:: Thin  Past Medical History  Diagnosis Date  . Hypertension   . Vertigo   . TIA (transient ischemic attack)   . Elevated cholesterol   . Thyroid disease     Physical exam:  Constitutional: well developed, pleasant female in no apparent distress. Eyes: no jaundice or exophthalmos.  Head: normocephalic. Neck: supple, no bruits, no JVD. Cardiac: no murmurs. Lungs: clear. Abdomen: soft, no tender, no mass. Extremities: no edema, clubbing, or cyanosis.  Skin: no rash  Neurologic Exam:  General: NAD Mental Status: Alert, oriented to place-year, thought content appropriate. ? Mild dysarthria without evidence of aphasia.  Able to follow 3 step commands without difficulty. Cranial Nerves: II: Discs flat bilaterally; Visual fields grossly normal, pupils equal, round, reactive to  light and accommodation III,IV, VI: ptosis not present, extra-ocular motions intact bilaterally V,VII: smile symmetric, facial light touch sensation normal bilaterally VIII: hearing normal bilaterally IX,X: uvula rises symmetrically XI: bilateral shoulder shrug XII: midline tongue extension without atrophy or fasciculations Motor: No frank muscle weakness Tone and bulk:normal tone throughout; no atrophy noted  Sensory: Pinprick and light touch intact throughout, bilaterally Deep Tendon Reflexes:  1 all over Plantars: Right: downgoing   Left: downgoing Cerebellar: normal finger-to-nose,  normal heel-to-shin test Gait:  No tested due to multiple leads    Lab Results: Lab Results  Component Value Date/Time   CHOL 179 06/03/2015 07:21 PM   Lipid Panel  Recent Labs  06/03/15 1921  CHOL 179  TRIG 75  HDL 90  CHOLHDL 2.0  VLDL 15  LDLCALC 74    Studies/Results: Dg Chest 2 View  06/03/2015  CLINICAL DATA:  Patient with history of TIA.  No chest complaints. EXAM: CHEST  2 VIEW COMPARISON:  None. FINDINGS: Monitoring leads overlie the patient. Stable cardiac and mediastinal contours. Low lung volumes. No consolidative pulmonary opacities. No pleural effusion or pneumothorax. Minimal heterogeneous opacities left lung base. IMPRESSION: Low lung volumes with minimal heterogeneous opacities left lung base favored to represent atelectasis. Electronically Signed   By: Annia Belt M.D.   On: 06/03/2015 22:13   Ct Head Wo Contrast  06/03/2015  CLINICAL DATA:  81 year old with prior strokes who presents with acute onset of left upper and lower extremity weakness and slurred speech witnessed by her granddaughter earlier today. EXAM: CT HEAD WITHOUT CONTRAST TECHNIQUE: Contiguous axial images were obtained from the base of the skull through the vertex without intravenous contrast. COMPARISON:  None. FINDINGS: Encephalomalacia involving both occipital lobes, right greater than left, and the  right frontal lobe. Severe changes of small vessel disease of the white matter, particularly in the basal ganglia bilaterally and in the periventricular white matter. Physiologic calcifications in the basal ganglia. Ventricular system normal in size and appearance for age. Moderate generalized age related atrophy. No mass lesion. No midline shift. No acute hemorrhage or hematoma. No extra-axial fluid collections. No evidence of acute infarction. No skull fracture or other focal osseous abnormality involving the skull. Visualized paranasal sinuses, bilateral mastoid air cells and bilateral middle ear cavities well-aerated. Extensive bilateral carotid siphon and vertebral artery atherosclerosis. IMPRESSION: 1. No acute intracranial abnormality. 2. Old cortical stroke involving both occipital lobes (right greater than left) and the right frontal lobe. 3. Moderate generalized age related atrophy and severe chronic microvascular ischemic changes of the white matter. Electronically Signed   By: Hulan Saas M.D.   On: 06/03/2015 11:58   Mr Shirlee Latch Wo Contrast  06/03/2015  CLINICAL DATA:  81 year old hypertensive female with left-sided weakness, dysarthria and left focal seizure. Subsequent encounter. EXAM: MRI HEAD WITHOUT CONTRAST MRA HEAD WITHOUT CONTRAST TECHNIQUE: Multiplanar, multiecho pulse sequences of the brain and surrounding structures were obtained without intravenous contrast. Angiographic images of the head were obtained using MRA technique without contrast. COMPARISON:  06/03/2015 head CT.  No comparison brain MR. FINDINGS: MRI HEAD FINDINGS No acute infarct. Remote bilateral occipital lobe infarcts and right frontal lobe infarct with encephalomalacia. No intracranial hemorrhage. Global atrophy without hydrocephalus. No intracranial mass lesion noted on this unenhanced exam. Mild spinal stenosis C3-4. Cervical medullary junction, pituitary region, pineal region and orbital structures unremarkable.  Post lens replacement. MRA HEAD FINDINGS Anterior circulation without medium or large size vessel significant stenosis or occlusion. Ectatic vertebral arteries with right vertebral artery dominant in size without high-grade stenosis. Ectatic basilar artery without high-grade stenosis. Branch vessel narrowing and irregularity most notable involving the left posterior cerebellar artery. No aneurysm IMPRESSION: MRI HEAD No acute infarct. Remote bilateral occipital lobe infarcts and right frontal lobe infarct with encephalomalacia. Global atrophy without hydrocephalus. No intracranial mass lesion  noted on this unenhanced exam. MRA HEAD Anterior circulation without medium or large size vessel significant stenosis or occlusion. Ectatic vertebral arteries with right vertebral artery dominant in size without high-grade stenosis. Ectatic basilar artery without high-grade stenosis. Branch vessel narrowing and irregularity most notable involving the left posterior cerebellar artery. Electronically Signed   By: Lacy Duverney M.D.   On: 06/03/2015 17:36   Mr Brain Wo Contrast  06/03/2015  CLINICAL DATA:  81 year old hypertensive female with left-sided weakness, dysarthria and left focal seizure. Subsequent encounter. EXAM: MRI HEAD WITHOUT CONTRAST MRA HEAD WITHOUT CONTRAST TECHNIQUE: Multiplanar, multiecho pulse sequences of the brain and surrounding structures were obtained without intravenous contrast. Angiographic images of the head were obtained using MRA technique without contrast. COMPARISON:  06/03/2015 head CT.  No comparison brain MR. FINDINGS: MRI HEAD FINDINGS No acute infarct. Remote bilateral occipital lobe infarcts and right frontal lobe infarct with encephalomalacia. No intracranial hemorrhage. Global atrophy without hydrocephalus. No intracranial mass lesion noted on this unenhanced exam. Mild spinal stenosis C3-4. Cervical medullary junction, pituitary region, pineal region and orbital structures  unremarkable. Post lens replacement. MRA HEAD FINDINGS Anterior circulation without medium or large size vessel significant stenosis or occlusion. Ectatic vertebral arteries with right vertebral artery dominant in size without high-grade stenosis. Ectatic basilar artery without high-grade stenosis. Branch vessel narrowing and irregularity most notable involving the left posterior cerebellar artery. No aneurysm IMPRESSION: MRI HEAD No acute infarct. Remote bilateral occipital lobe infarcts and right frontal lobe infarct with encephalomalacia. Global atrophy without hydrocephalus. No intracranial mass lesion noted on this unenhanced exam. MRA HEAD Anterior circulation without medium or large size vessel significant stenosis or occlusion. Ectatic vertebral arteries with right vertebral artery dominant in size without high-grade stenosis. Ectatic basilar artery without high-grade stenosis. Branch vessel narrowing and irregularity most notable involving the left posterior cerebellar artery. Electronically Signed   By: Lacy Duverney M.D.   On: 06/03/2015 17:36    MEDICATIONS                                                                                                                        Scheduled: . acetaminophen  500 mg Oral BID  . aspirin EC  81 mg Oral Daily  . atorvastatin  20 mg Oral Daily  . clopidogrel  75 mg Oral Daily  . levothyroxine  50 mcg Oral QAC breakfast  . phenytoin (DILANTIN) IV  100 mg Intravenous 3 times per day    ASSESSMENT/PLAN:  81 y/o admitted with new onset left focal motor and left sided weakness (likely ictal weakness), unremarkable MRI brain, normal EEG. She was started on dilantin in the ED, but considering patient age dilantin is not the most ideal AED and thus will switch to keppra. Recommend: 1) Start keppra 500 mg BID. 2) D/C dilantin in am.   Wyatt Portelasvaldo Marnesha Gagen,  MD [facility] [phone]  06/04/2015, 10:03 AM

## 2015-06-04 NOTE — Progress Notes (Signed)
While RN administering Dilantin IV, IV infiltrated in right arm. Arm elevated and warm compress applied. Will continue to monitor. Gara KronerHayles, Analiza Cowger M, RN

## 2015-06-04 NOTE — Progress Notes (Signed)
*  PRELIMINARY RESULTS* Echocardiogram 2D Echocardiogram has been performed.  Yvette Guerrero, Yvette Guerrero 06/04/2015, 3:00 PM

## 2015-06-04 NOTE — Evaluation (Addendum)
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 Patient requires +2 mod to max assist [facility] (due to Lt weakness and lean to her Left).  Patient with no insight into her deficits and impulsively attempts to stand despite weakness and repeated cues to wait for therapist. This represents a significant functional decline (was ambulatory with rollator without assistance PTA). Daughter present and tearful that she cannot provide this level of assist for her mother. Pt currently with functional limitations due to the deficits listed below (see PT Problem List). Pt will benefit from skilled PT to increase their independence and safety with mobility to allow discharge to the venue listed below.       Follow Up Recommendations SNF    Equipment Recommendations  None recommended by PT    Recommendations for Other Services OT consult;Speech consult     Precautions / Restrictions Precautions Precautions: Fall      Mobility  Bed Mobility Overal bed mobility: Needs Assistance Bed Mobility: Supine to Sit;Sit to Supine     Supine to sit: Max assist Sit to supine: Max assist;+2 for physical assistance   General bed mobility comments: pt exits to her Rt at home; able to push up onto Rt elbow and get feet over EOB, however could not progress further without assist; losing balance posterior and to the Left;   Transfers Overall transfer level: Needs assistance Equipment used: None Transfers: Sit to/from [employer];Stand Pivot Transfers Sit to Stand: Mod assist Stand pivot transfers: Mod assist;Max assist;+2 physical assistance       General transfer  comment: stood x 1 from EOB with mod assist and full upright; stated need to use bathroom, [facility]SC placed to her Lt and [facility]; stood again to remove undergament; after sitting >5 minutes using restroom and waiting for assist, required +2 max assist to pivot to her rt to bed (very fatigued)  Ambulation/Gait                Stairs            Wheelchair Mobility    Modified Rankin (Stroke Patients Only) Modified Rankin (Stroke Patients Only) Pre-Morbid Rankin Score: Moderate disability Modified Rankin: Severe disability     Balance Overall balance assessment: Needs assistance Sitting-balance support: No upper extremity supported;Feet supported Sitting balance-Leahy Scale: Zero Sitting balance - Comments: leans Lt and posterior with increasing fatigue   Standing balance support: Single extremity supported Standing balance-Leahy Scale: Poor                               Pertinent Vitals/Pain Pain Assessment: Faces Faces Pain Scale: No hurt    Home Living Family/patient expects to be discharged to:: [place]; was here visiting niece) Living Arrangements: Children (daughter and her husband) Available Help at Discharge: Family;Available 24 hours/day Type of Home: House (Apartment within daughter's home) Home Access: Level entry     Home Layout: One level Home Equipment: Walker - 4 wheels;Grab bars - tub/shower;Grab bars - toilet;Cane - quad;Walker - 2 wheels;Hand held shower head      Prior Function  Level of Independence: Needs assistance   Gait / Transfers Assistance Needed: uses rollator modified independent (except must use quad cane and counter to get into bathroom)  ADL's / Homemaking Assistance Needed: assist with bathing, dressing        Hand Dominance        Extremity/Trunk Assessment   Upper Extremity Assessment: Defer to OT evaluation           Lower Extremity Assessment: RLE  deficits/detail;LLE deficits/detail RLE Deficits / Details: ROM WFL; Strength 4/5 throughout LLE Deficits / Details: ROM WFL; strength 3+ to 4  Cervical / Trunk Assessment: Kyphotic  Communication   Communication: Receptive difficulties;Expressive difficulties  Cognition Arousal/Alertness: Awake/alert Behavior During Therapy: WFL for tasks assessed/performed Overall Cognitive Status: Difficult to assess                      General Comments General comments (skin integrity, edema, etc.): On return to supine pt reported feeling nauseous; pt rolled onto her left side with large volume of dark red/brown emesis. Nurse tech present and assisted with caring for pt    Exercises        Assessment/Plan    PT Assessment Patient needs continued PT services  PT Diagnosis Difficulty walking;Generalized weakness;Hemiplegia non-dominant side   PT Problem List Decreased strength;Decreased activity tolerance;Decreased balance;Decreased mobility;Decreased knowledge of use of DME;Decreased safety awareness;Obesity  PT Treatment Interventions DME instruction;Functional mobility training;Therapeutic activities;Balance training;Neuromuscular re-education;Cognitive remediation;Patient/family education   PT Goals (Current goals can be found in the Care Plan section) Acute Rehab PT Goals Patient Stated Goal: unable to state PT Goal Formulation: With family Time For Goal Achievement: 06/18/15 Potential to Achieve Goals: Fair    Frequency Min 3X/week   Barriers to discharge Decreased caregiver support      Co-evaluation               End of Session Equipment Utilized During Treatment: Gait belt Activity Tolerance: Patient limited by fatigue Patient left: in bed;with nursing/sitter in room (2 nurse techs cleaning pt; changing linens) Nurse Communication: Mobility status;Other (comment) (emesis)    Functional Assessment Tool Used: clinical judgement Functional Limitation: Mobility:  Walking and moving around Mobility: Walking and Moving Around Current Status [phone]): At least 80 percent but less than 100 percent impaired, limited or restricted Mobility: Walking and Moving Around Goal Status [phone]): At least 40 percent but less than 60 percent impaired, limited or restricted    Time: 2956-2130 PT Time Calculation (min) (ACUTE ONLY): 44 min   Charges:   PT Evaluation $Initial PT Evaluation Tier I: 1 Procedure PT Treatments $Therapeutic Activity: 23-37 mins   PT G Codes:   PT G-Codes **NOT FOR INPATIENT CLASS** Functional Assessment Tool Used: clinical judgement Functional Limitation: Mobility: Walking and moving around Mobility: Walking and Moving Around Current Status ([id number]): At least 80 percent but less than 100 percent impaired, limited or restricted Mobility: Walking and Moving Around Goal Status [phone]): At least 40 percent but less than 60 percent impaired, limited or restricted    Yvette Guerrero 06/04/2015, 12:10 PM Pager [phone]

## 2015-06-04 NOTE — Progress Notes (Signed)
[facility] PROGRESS NOTE  Yvette Guerrero JXB:[id number] DOB: [birth year] DOA: 06/03/2015 PCP: No primary care provider on file.  Assessment/Plan: 1. Suspected transient ischemic attack -Yvette. Guerrero having a history of recurrent TIAs, presented with complaints of dysarthria is associate with left-sided weakness. Initial CT scan of brain was negative. This was followed by an MRI that did not reveal acute intracranial abnormality. -There has been improvement in the past 24 hours as is awake alert able to feed herself. -Physical therapy consulted.  2.  Urinary tract infection. -Urine analysis showing the presence of bacteria and nitrates. Family members reporting r mental status changes associated with focal neurological deficits -She was started on ceftriaxone 1 g IV every 24 hours  3.  Possible seizure disorder. -She was started on Keppra 500 mg by mouth twice a day by neurology. -EEG was negative.  4.  Hypertension. -Blood pressure stable with systolic blood pressures in the 130s to 140s off of antihypertensive agents. We'll continue to monitor.  5.  Hypothyroidism. -Continue Synthroid 50 g by mouth daily  6.  Dyslipidemia. -Continue atorvastatin 20 mg by mouth daily   Code Status: Full code Family Communication: Spoke to her daughter who is present at bedside Disposition Plan:    Consultants:  Neurology  Antibiotics:  Ceftriaxone  HPI/Subjective: Yvette Guerrero is a 81 year old female with a past medical history of hypertension, dyslipidemia, history recurrent TIAs, admitted to the medicine service on 06/03/2015 presented with complaints of left-sided weakness. Initial workup included a CT scan of brain without contrast that did not reveal acute CVA. Radiology noted old cortical stroke involving occipital lobes right greater than left and the right frontal lobe. She was further worked up with MRI of the brain that did not reveal acute infarct. Suspect symptoms secondary  to TIA. Urinalysis did reveal the presence of many bacteria with positive nitrates. It is also conceivable that UTI may have led to mental status changes. There is also question of seizure activity for which she was started on antiepileptic therapy. She was started on empiric IV antibiotic therapy with ceftriaxone.  Objective: Filed Vitals:   06/04/15 1004  BP: 135/63  Pulse: 77  Temp: 98.7 F (37.1 C)  Resp: 18   No intake or output data in the 24 hours ending 06/04/15 1726 Filed Weights   06/03/15 1004  Weight: 72.576 kg (160 lb)    Exam:   General:  Patient is pleasantly confused, was just admitted chair  Cardiovascular: Regular rate and rhythm normal S1S2  Respiratory: Normal respiratory effort  Abdomen: Soft nontender nondistended  Musculoskeletal: No edema  Neurological: Patient occasionally slurring her words, there may be mild right-sided facial droop. Had lot of 5 muscle strength to bilateral upper and lower extremities.  Data Reviewed: Basic Metabolic Panel:  Recent Labs Lab 06/03/15 1000 06/03/15 1025  NA 143 142  K 4.2 4.0  CL 106 106  CO2 25  --   GLUCOSE 118* 108*  BUN 28* 31*  CREATININE 1.39* 1.40*  CALCIUM 9.5  --    Liver Function Tests:  Recent Labs Lab 06/03/15 1000  AST 18  ALT 16  ALKPHOS 57  BILITOT 1.5*  PROT 6.6  ALBUMIN 3.5   No results for input(s): LIPASE, AMYLASE in the last 168 hours. No results for input(s): AMMONIA in the last 168 hours. CBC:  Recent Labs Lab 06/03/15 1000 06/03/15 1025  WBC 7.9  --   NEUTROABS 5.3  --   HGB 13.8 14.6  HCT 41.0 43.0  MCV 99.0  --   PLT 170  --    Cardiac Enzymes: No results for input(s): CKTOTAL, CKMB, CKMBINDEX, TROPONINI in the last 168 hours. BNP (last 3 results) No results for input(s): BNP in the last 8760 hours.  ProBNP (last 3 results) No results for input(s): PROBNP in the last 8760 hours.  CBG:  Recent Labs Lab 06/03/15 1037  GLUCAP 99    No results  found for this or any previous visit (from the past 240 hour(s)).   Studies: Dg Chest 2 View  06/03/2015  CLINICAL DATA:  Patient with history of TIA.  No chest complaints. EXAM: CHEST  2 VIEW COMPARISON:  None. FINDINGS: Monitoring leads overlie the patient. Stable cardiac and mediastinal contours. Low lung volumes. No consolidative pulmonary opacities. No pleural effusion or pneumothorax. Minimal heterogeneous opacities left lung base. IMPRESSION: Low lung volumes with minimal heterogeneous opacities left lung base favored to represent atelectasis. Electronically Signed   By: Annia Belt M.D.   On: 06/03/2015 22:13   Ct Head Wo Contrast  06/03/2015  CLINICAL DATA:  81 year old with prior strokes who presents with acute onset of left upper and lower extremity weakness and slurred speech witnessed by her granddaughter earlier today. EXAM: CT HEAD WITHOUT CONTRAST TECHNIQUE: Contiguous axial images were obtained from the base of the skull through the vertex without intravenous contrast. COMPARISON:  None. FINDINGS: Encephalomalacia involving both occipital lobes, right greater than left, and the right frontal lobe. Severe changes of small vessel disease of the white matter, particularly in the basal ganglia bilaterally and in the periventricular white matter. Physiologic calcifications in the basal ganglia. Ventricular system normal in size and appearance for age. Moderate generalized age related atrophy. No mass lesion. No midline shift. No acute hemorrhage or hematoma. No extra-axial fluid collections. No evidence of acute infarction. No skull fracture or other focal osseous abnormality involving the skull. Visualized paranasal sinuses, bilateral mastoid air cells and bilateral middle ear cavities well-aerated. Extensive bilateral carotid siphon and vertebral artery atherosclerosis. IMPRESSION: 1. No acute intracranial abnormality. 2. Old cortical stroke involving both occipital lobes (right greater than  left) and the right frontal lobe. 3. Moderate generalized age related atrophy and severe chronic microvascular ischemic changes of the white matter. Electronically Signed   By: Hulan Saas M.D.   On: 06/03/2015 11:58   Mr Shirlee Latch Wo Contrast  06/03/2015  CLINICAL DATA:  81 year old hypertensive female with left-sided weakness, dysarthria and left focal seizure. Subsequent encounter. EXAM: MRI HEAD WITHOUT CONTRAST MRA HEAD WITHOUT CONTRAST TECHNIQUE: Multiplanar, multiecho pulse sequences of the brain and surrounding structures were obtained without intravenous contrast. Angiographic images of the head were obtained using MRA technique without contrast. COMPARISON:  06/03/2015 head CT.  No comparison brain MR. FINDINGS: MRI HEAD FINDINGS No acute infarct. Remote bilateral occipital lobe infarcts and right frontal lobe infarct with encephalomalacia. No intracranial hemorrhage. Global atrophy without hydrocephalus. No intracranial mass lesion noted on this unenhanced exam. Mild spinal stenosis C3-4. Cervical medullary junction, pituitary region, pineal region and orbital structures unremarkable. Post lens replacement. MRA HEAD FINDINGS Anterior circulation without medium or large size vessel significant stenosis or occlusion. Ectatic vertebral arteries with right vertebral artery dominant in size without high-grade stenosis. Ectatic basilar artery without high-grade stenosis. Branch vessel narrowing and irregularity most notable involving the left posterior cerebellar artery. No aneurysm IMPRESSION: MRI HEAD No acute infarct. Remote bilateral occipital lobe infarcts and right frontal lobe infarct with encephalomalacia. Global atrophy without hydrocephalus. No intracranial mass  lesion noted on this unenhanced exam. MRA HEAD Anterior circulation without medium or large size vessel significant stenosis or occlusion. Ectatic vertebral arteries with right vertebral artery dominant in size without high-grade  stenosis. Ectatic basilar artery without high-grade stenosis. Branch vessel narrowing and irregularity most notable involving the left posterior cerebellar artery. Electronically Signed   By: Lacy DuverneySteven  Olson M.D.   On: 06/03/2015 17:36   Mr Brain Wo Contrast  06/03/2015  CLINICAL DATA:  81 year old hypertensive female with left-sided weakness, dysarthria and left focal seizure. Subsequent encounter. EXAM: MRI HEAD WITHOUT CONTRAST MRA HEAD WITHOUT CONTRAST TECHNIQUE: Multiplanar, multiecho pulse sequences of the brain and surrounding structures were obtained without intravenous contrast. Angiographic images of the head were obtained using MRA technique without contrast. COMPARISON:  06/03/2015 head CT.  No comparison brain MR. FINDINGS: MRI HEAD FINDINGS No acute infarct. Remote bilateral occipital lobe infarcts and right frontal lobe infarct with encephalomalacia. No intracranial hemorrhage. Global atrophy without hydrocephalus. No intracranial mass lesion noted on this unenhanced exam. Mild spinal stenosis C3-4. Cervical medullary junction, pituitary region, pineal region and orbital structures unremarkable. Post lens replacement. MRA HEAD FINDINGS Anterior circulation without medium or large size vessel significant stenosis or occlusion. Ectatic vertebral arteries with right vertebral artery dominant in size without high-grade stenosis. Ectatic basilar artery without high-grade stenosis. Branch vessel narrowing and irregularity most notable involving the left posterior cerebellar artery. No aneurysm IMPRESSION: MRI HEAD No acute infarct. Remote bilateral occipital lobe infarcts and right frontal lobe infarct with encephalomalacia. Global atrophy without hydrocephalus. No intracranial mass lesion noted on this unenhanced exam. MRA HEAD Anterior circulation without medium or large size vessel significant stenosis or occlusion. Ectatic vertebral arteries with right vertebral artery dominant in size without  high-grade stenosis. Ectatic basilar artery without high-grade stenosis. Branch vessel narrowing and irregularity most notable involving the left posterior cerebellar artery. Electronically Signed   By: Lacy DuverneySteven  Olson M.D.   On: 06/03/2015 17:36    Scheduled Meds: . acetaminophen  500 mg Oral BID  . aspirin EC  81 mg Oral Daily  . atorvastatin  20 mg Oral Daily  . cefTRIAXone (ROCEPHIN)  IV  1 g Intravenous Q24H  . clopidogrel  75 mg Oral Daily  . levETIRAcetam  500 mg Oral BID  . levothyroxine  50 mcg Oral QAC breakfast   Continuous Infusions:   Active Problems:   CVA (cerebral infarction)   Hypertension   Vertigo   Thyroid disease   Acute kidney injury (HCC)   Seizure (HCC)   TIA (transient ischemic attack)    Time spent: 30 min    Jeralyn BennettZAMORA, Malasha Kleppe  [facility] Pager [phone]. If 7PM-7AM, please contact night-coverage at [email or web address], password [email or web address] 06/04/2015, 5:26 PM  LOS: 0 days

## 2015-06-05 DIAGNOSIS — I10 Essential (primary) hypertension: Secondary | ICD-10-CM | POA: Insufficient documentation

## 2015-06-05 LAB — BASIC METABOLIC PANEL
Anion gap: 11 (ref 5–15)
BUN: 19 mg/dL (ref 6–20)
CALCIUM: 9.1 mg/dL (ref 8.9–10.3)
CO2: 23 mmol/L (ref 22–32)
CREATININE: 1.15 mg/dL — AB (ref 0.44–1.00)
Chloride: 105 mmol/L (ref 101–111)
GFR calc non Af Amer: 40 mL/min — ABNORMAL LOW (ref >60)
GFR, EST AFRICAN AMERICAN: 46 mL/min — AB (ref >60)
Glucose, Bld: 97 mg/dL (ref 65–99)
Potassium: 3.4 mmol/L — ABNORMAL LOW (ref 3.5–5.1)
SODIUM: 139 mmol/L (ref 135–145)

## 2015-06-05 LAB — CBC
HCT: 38.9 % (ref 36.0–46.0)
Hemoglobin: 12.8 g/dL (ref 12.0–15.0)
MCH: 32.5 pg (ref 26.0–34.0)
MCHC: 32.9 g/dL (ref 30.0–36.0)
MCV: 98.7 fL (ref 78.0–100.0)
PLATELETS: 149 10*3/uL — AB (ref 150–400)
RBC: 3.94 MIL/uL (ref 3.87–5.11)
RDW: 13.6 % (ref 11.5–15.5)
WBC: 11 10*3/uL — AB (ref 4.0–10.5)

## 2015-06-05 MED ORDER — CEFUROXIME AXETIL 500 MG PO TABS
250.0000 mg | ORAL_TABLET | Freq: Two times a day (BID) | ORAL | Status: DC
  Administered 2015-06-05 – 2015-06-06 (×3): 250 mg via ORAL
  Filled 2015-06-05 (×3): qty 1

## 2015-06-05 MED ORDER — POTASSIUM CHLORIDE CRYS ER 20 MEQ PO TBCR
40.0000 meq | EXTENDED_RELEASE_TABLET | Freq: Once | ORAL | Status: AC
  Administered 2015-06-05: 40 meq via ORAL
  Filled 2015-06-05: qty 2

## 2015-06-05 MED ORDER — AMLODIPINE BESYLATE 5 MG PO TABS
5.0000 mg | ORAL_TABLET | Freq: Every day | ORAL | Status: DC
  Administered 2015-06-05 – 2015-06-06 (×2): 5 mg via ORAL
  Filled 2015-06-05 (×2): qty 1

## 2015-06-05 NOTE — Progress Notes (Signed)
NEURO HOSPITALIST PROGRESS NOTE   SUBJECTIVE:                                                                                                                        Sitting in a chair, in good spirits, no neurological complains. Family at the bedside commenting that she is looking great today and her speech is back to baseline. Now off dilantin and taking keppra 500 mg BID. MRI brain without acute abnormality but remote bilateral occipital lobe infarcts and right frontal lobe infarct with encephalomalacia. EEG normal.  OBJECTIVE:                                                                                                                           Vital signs in last 24 hours: Temp:  [98 F (36.7 C)-99.8 F (37.7 C)] 98.7 F (37.1 C) (11/10 1022) Pulse Rate:  [76-92] 76 (11/10 1022) Resp:  [18-20] 20 (11/10 1022) BP: (130-179)/(57-89) 153/70 mmHg (11/10 1022) SpO2:  [94 %-97 %] 97 % (11/10 1022)  Intake/Output from previous day:   Intake/Output this shift:   Nutritional status: Diet Heart Room service appropriate?: Yes; Fluid consistency:: Thin  Past Medical History  Diagnosis Date  . Hypertension   . Vertigo   . TIA (transient ischemic attack)   . Elevated cholesterol   . Thyroid disease    Physical exam:  Constitutional: well developed, pleasant female in no apparent distress. Eyes: no jaundice or exophthalmos.  Head: normocephalic. Neck: supple, no bruits, no JVD. Cardiac: no murmurs. Lungs: clear. Abdomen: soft, no tender, no mass. Extremities: no edema, clubbing, or cyanosis.  Skin: no rash Neurologic Exam:  Mental Status: Alert, oriented to place-year, thought content appropriate. No or aphasia. Able to follow 3 step commands without difficulty. Cranial Nerves: II: Discs flat bilaterally; Visual fields grossly normal, pupils equal, round, reactive to light and accommodation III,IV, VI: ptosis not present, extra-ocular  motions intact bilaterally V,VII: smile symmetric, facial light touch sensation normal bilaterally VIII: hearing normal bilaterally IX,X: uvula rises symmetrically XI: bilateral shoulder shrug XII: midline tongue extension without atrophy or fasciculations Motor: No frank muscle weakness Tone and bulk:normal tone throughout; no atrophy noted Sensory: Pinprick and light touch intact throughout, bilaterally Deep Tendon  Reflexes:  1 all over Plantars: Right: downgoingLeft: downgoing Cerebellar: normal finger-to-nose, normal heel-to-shin test Gait:  No tested due to multiple leads  Lab Results: Lab Results  Component Value Date/Time   CHOL 179 06/03/2015 07:21 PM   Lipid Panel  Recent Labs  06/03/15 1921  CHOL 179  TRIG 75  HDL 90  CHOLHDL 2.0  VLDL 15  LDLCALC 74    Studies/Results: Dg Chest 2 View  06/03/2015  CLINICAL DATA:  Patient with history of TIA.  No chest complaints. EXAM: CHEST  2 VIEW COMPARISON:  None. FINDINGS: Monitoring leads overlie the patient. Stable cardiac and mediastinal contours. Low lung volumes. No consolidative pulmonary opacities. No pleural effusion or pneumothorax. Minimal heterogeneous opacities left lung base. IMPRESSION: Low lung volumes with minimal heterogeneous opacities left lung base favored to represent atelectasis. Electronically Signed   By: Annia Beltrew  Davis M.D.   On: 06/03/2015 22:13   Ct Head Wo Contrast  06/03/2015  CLINICAL DATA:  81 year old with prior strokes who presents with acute onset of left upper and lower extremity weakness and slurred speech witnessed by her granddaughter earlier today. EXAM: CT HEAD WITHOUT CONTRAST TECHNIQUE: Contiguous axial images were obtained from the base of the skull through the vertex without intravenous contrast. COMPARISON:  None. FINDINGS: Encephalomalacia involving both occipital lobes, right greater than left, and the right frontal lobe. Severe changes of small  vessel disease of the white matter, particularly in the basal ganglia bilaterally and in the periventricular white matter. Physiologic calcifications in the basal ganglia. Ventricular system normal in size and appearance for age. Moderate generalized age related atrophy. No mass lesion. No midline shift. No acute hemorrhage or hematoma. No extra-axial fluid collections. No evidence of acute infarction. No skull fracture or other focal osseous abnormality involving the skull. Visualized paranasal sinuses, bilateral mastoid air cells and bilateral middle ear cavities well-aerated. Extensive bilateral carotid siphon and vertebral artery atherosclerosis. IMPRESSION: 1. No acute intracranial abnormality. 2. Old cortical stroke involving both occipital lobes (right greater than left) and the right frontal lobe. 3. Moderate generalized age related atrophy and severe chronic microvascular ischemic changes of the white matter. Electronically Signed   By: Hulan Saashomas  Lawrence M.D.   On: 06/03/2015 11:58   Mr Shirlee LatchMra Head Wo Contrast  06/03/2015  CLINICAL DATA:  81 year old hypertensive female with left-sided weakness, dysarthria and left focal seizure. Subsequent encounter. EXAM: MRI HEAD WITHOUT CONTRAST MRA HEAD WITHOUT CONTRAST TECHNIQUE: Multiplanar, multiecho pulse sequences of the brain and surrounding structures were obtained without intravenous contrast. Angiographic images of the head were obtained using MRA technique without contrast. COMPARISON:  06/03/2015 head CT.  No comparison brain MR. FINDINGS: MRI HEAD FINDINGS No acute infarct. Remote bilateral occipital lobe infarcts and right frontal lobe infarct with encephalomalacia. No intracranial hemorrhage. Global atrophy without hydrocephalus. No intracranial mass lesion noted on this unenhanced exam. Mild spinal stenosis C3-4. Cervical medullary junction, pituitary region, pineal region and orbital structures unremarkable. Post lens replacement. MRA HEAD FINDINGS  Anterior circulation without medium or large size vessel significant stenosis or occlusion. Ectatic vertebral arteries with right vertebral artery dominant in size without high-grade stenosis. Ectatic basilar artery without high-grade stenosis. Branch vessel narrowing and irregularity most notable involving the left posterior cerebellar artery. No aneurysm IMPRESSION: MRI HEAD No acute infarct. Remote bilateral occipital lobe infarcts and right frontal lobe infarct with encephalomalacia. Global atrophy without hydrocephalus. No intracranial mass lesion noted on this unenhanced exam. MRA HEAD Anterior circulation without medium or large size vessel significant  stenosis or occlusion. Ectatic vertebral arteries with right vertebral artery dominant in size without high-grade stenosis. Ectatic basilar artery without high-grade stenosis. Branch vessel narrowing and irregularity most notable involving the left posterior cerebellar artery. Electronically Signed   By: Lacy Duverney M.D.   On: 06/03/2015 17:36   Mr Brain Wo Contrast  06/03/2015  CLINICAL DATA:  81 year old hypertensive female with left-sided weakness, dysarthria and left focal seizure. Subsequent encounter. EXAM: MRI HEAD WITHOUT CONTRAST MRA HEAD WITHOUT CONTRAST TECHNIQUE: Multiplanar, multiecho pulse sequences of the brain and surrounding structures were obtained without intravenous contrast. Angiographic images of the head were obtained using MRA technique without contrast. COMPARISON:  06/03/2015 head CT.  No comparison brain MR. FINDINGS: MRI HEAD FINDINGS No acute infarct. Remote bilateral occipital lobe infarcts and right frontal lobe infarct with encephalomalacia. No intracranial hemorrhage. Global atrophy without hydrocephalus. No intracranial mass lesion noted on this unenhanced exam. Mild spinal stenosis C3-4. Cervical medullary junction, pituitary region, pineal region and orbital structures unremarkable. Post lens replacement. MRA HEAD  FINDINGS Anterior circulation without medium or large size vessel significant stenosis or occlusion. Ectatic vertebral arteries with right vertebral artery dominant in size without high-grade stenosis. Ectatic basilar artery without high-grade stenosis. Branch vessel narrowing and irregularity most notable involving the left posterior cerebellar artery. No aneurysm IMPRESSION: MRI HEAD No acute infarct. Remote bilateral occipital lobe infarcts and right frontal lobe infarct with encephalomalacia. Global atrophy without hydrocephalus. No intracranial mass lesion noted on this unenhanced exam. MRA HEAD Anterior circulation without medium or large size vessel significant stenosis or occlusion. Ectatic vertebral arteries with right vertebral artery dominant in size without high-grade stenosis. Ectatic basilar artery without high-grade stenosis. Branch vessel narrowing and irregularity most notable involving the left posterior cerebellar artery. Electronically Signed   By: Lacy Duverney M.D.   On: 06/03/2015 17:36    MEDICATIONS                                                                                                                        Scheduled: . acetaminophen  500 mg Oral BID  . aspirin EC  81 mg Oral Daily  . atorvastatin  20 mg Oral Daily  . cefTRIAXone (ROCEPHIN)  IV  1 g Intravenous Q24H  . clopidogrel  75 mg Oral Daily  . levETIRAcetam  500 mg Oral BID  . levothyroxine  50 mcg Oral QAC breakfast    ASSESSMENT/PLAN:  81 y/o admitted with new onset left focal motor and left sided weakness (likely ictal weakness), unremarkable MRI brain for acute abnormality but remote bilateral occipital lobe infarcts and right frontal lobe infarct with encephalomalacia. Normal EEG. Family indicated that patient first had left face-arm-leg weakness and the jerking movements of the left side developed  approximately 2 hours later. A focal motor seizure in the context of TIA is not a feasible explanation, and she has no evidence of hemodynamically significant carotid disease to think that she had a limb shaking TIA which typically resembles a focal seizure. I am more inclined to believe that patient had a symptomatic left focal motor seizure with ictal weakness, most likely resulting from an old right frontal infarct and thus will advise continuing   keppra 500 mg BID. Outpatient neurology follow up in 3-4 weeks. Will sign off.   Wyatt Portela, MD [facility] [phone]  06/05/2015, 10:54 AM

## 2015-06-05 NOTE — Progress Notes (Signed)
Occupational Therapy Evaluation Patient Details Name: Yvette BeamMary Guerrero MRN: [id number] DOB: [birth year] Today's Date: 06/05/2015    History of Present Illness Adm 06/03/15 with decr SLP and Lt sided weakness; +witnessed seizure; MRI negatuive acute changes, +old bil occipital CVAs and Rt frontal CVA  PMHx-HTN, vertigo, prior CVAs   Clinical Impression   Pt admitted with the above diagnoses and presents with below problem list. Pt will benefit from continued acute OT to address the below listed deficits and maximize independence with BADLs prior to d/c to venue below. PTA pt was min A for shower transfers and LB ADLs, setup for UB ADLs, mod I with eating, grooming, and toilet transfers. Pt is currently +2 max physical A for SPT toilet transfers and LB ADLs, min A with UB ADLs. Impulsivity noted. Session details below. OT to continue to follow acutely.        Follow Up Recommendations  SNF    Equipment Recommendations  Other (comment) (TBD next venue)    Recommendations for Other Services       Precautions / Restrictions Precautions Precautions: Fall Restrictions Weight Bearing Restrictions: No      Mobility Bed Mobility Overal bed mobility: Needs Assistance Bed Mobility: Supine to Sit     Supine to sit: Mod assist     General bed mobility comments: Pt using rail with HOB elevated to about 30 degrees. Exited to pt's right side. Pt able to scoot LLE minimally with increased time and effort. Mod A to advance LLE fully to EOB. Pt initiating standing multiple times despite tactile and verbal cues to remain in seated position.   Transfers Overall transfer level: Needs assistance Equipment used: 2 person hand held assist Transfers: Sit to/from UGI CorporationStand;Stand Pivot Transfers Sit to Stand: Mod assist;+2 safety/equipment Stand pivot transfers: Max assist;+2 physical assistance       General transfer comment: mod A +2 for safety due to impulsive. Pt with difficulty advancing BLE during  pivoting to right side. Assist for balance and advancement of BLE during pivoting. Pt c/o knees feeling weak.     Balance Overall balance assessment: Needs assistance Sitting-balance support: No upper extremity supported;Feet supported Sitting balance-Leahy Scale: Poor Sitting balance - Comments: Pt did not remain in sitting position long due to impulsively standing. Pt with bilateral extremity support while sitting EOB.   Standing balance support: Bilateral upper extremity supported Standing balance-Leahy Scale: Poor Standing balance comment: +2 assist to stand and maintain standing position.                             ADL Overall ADL's : Needs assistance/impaired Eating/Feeding: Set up;Sitting   Grooming: Set up;Wash/dry hands;Wash/dry face;Sitting;Applying deodorant   Upper Body Bathing: Minimal assitance;Sitting   Lower Body Bathing: +2 for physical assistance;Sit to/from stand;Moderate assistance   Upper Body Dressing : Minimal assistance;Sitting   Lower Body Dressing: +2 for physical assistance;Sit to/from stand;Maximal assistance   Toilet Transfer: Maximal assistance;+2 for physical assistance;Stand-pivot;BSC   Toileting- Clothing Manipulation and Hygiene: Sit to/from stand;Maximal assistance;+2 for physical assistance   Tub/ Shower Transfer: Maximal assistance;+2 for physical assistance;Stand-pivot;3 in 1     General ADL Comments: Pt completed bed mobility with mod A to advance LLE to EOB. Pt impulsive during session initiating standing despite verbal and tactile cues. Pt max A SPT from EOB to recliner with assist to advance BLE transferring to pt's right side. Pt completed grooming tasks above with setup.     Vision  Perception     Praxis      Pertinent Vitals/Pain Pain Assessment: No/denies pain     Hand Dominance Right   Extremity/Trunk Assessment Upper Extremity Assessment Upper Extremity Assessment: Overall WFL for tasks  assessed;Generalized weakness (AROM WFL BUE)           Communication Communication Communication: Expressive difficulties;Other (comment) (some difficulty getting her words out)   Cognition Arousal/Alertness: Awake/alert Behavior During Therapy: Impulsive;WFL for tasks assessed/performed Overall Cognitive Status: Difficult to assess                     General Comments       Exercises       Shoulder Instructions      Home Living Family/patient expects to be discharged to:: Private residence (in [place]; was here visiting niece) Living Arrangements: Children (daughter and her husband) Available Help at Discharge: Family;Available 24 hours/day Type of Home: House (Apartment within daughter's home) Home Access: Level entry     Home Layout: One level     Bathroom Shower/Tub: Producer, television/film/video: Standard Bathroom Accessibility: No (rollator does not fit in bathroom; uses quad cane)   Home Equipment: Walker - 4 wheels;Grab bars - tub/shower;Grab bars - toilet;Cane - quad;Walker - 2 wheels;Hand held shower head;Bedside commode          Prior Functioning/Environment Level of Independence: Needs assistance  Gait / Transfers Assistance Needed: uses rollator modified independent (except must use quad cane and counter to get into bathroom) ADL's / Homemaking Assistance Needed: min A with dressing; min A with walkin shower transfers and LB bathing; mod I for remaining ADLs        OT Diagnosis: Generalized weakness;Cognitive deficits   OT Problem List: Decreased strength;Decreased activity tolerance;Impaired balance (sitting and/or standing);Decreased cognition;Decreased safety awareness;Decreased knowledge of use of DME or AE;Decreased knowledge of precautions   OT Treatment/Interventions: Self-care/ADL training;Therapeutic exercise;DME and/or AE instruction;Therapeutic activities;Cognitive remediation/compensation;Patient/family education;Balance  training    OT Goals(Current goals can be found in the care plan section) Acute Rehab OT Goals Patient Stated Goal: not stated OT Goal Formulation: With patient/family Time For Goal Achievement: 06/19/15 Potential to Achieve Goals: Good ADL Goals Pt Will Perform Grooming: with min assist;standing Pt Will Perform Upper Body Bathing: with set-up;sitting Pt Will Perform Lower Body Bathing: with mod assist;with adaptive equipment;sit to/from stand;sitting/lateral leans Pt Will Perform Upper Body Dressing: with set-up;sitting Pt Will Perform Lower Body Dressing: with mod assist;with adaptive equipment;sitting/lateral leans;sit to/from stand Pt Will Transfer to Toilet: with max assist;stand pivot transfer;bedside commode Pt Will Perform Toileting - Clothing Manipulation and hygiene: with mod assist;sitting/lateral leans;sit to/from stand Pt/caregiver will Perform Home Exercise Program: Both right and left upper extremity;Increased strength;With written HEP provided Additional ADL Goal #1: Pt will complete bed mobility at supervision level to prepare for OOB ADLs.   OT Frequency: Min 2X/week   Barriers to D/C:            Co-evaluation              End of Session Equipment Utilized During Treatment: Gait belt Nurse Communication: Other (comment) (impulsive, may need sitter)  Activity Tolerance: Patient tolerated treatment well;Patient limited by fatigue Patient left: in chair;with call bell/phone within reach;with chair alarm set;with family/visitor present   Time: 1610-9604 OT Time Calculation (min): 26 min Charges:  OT General Charges $OT Visit: 1 Procedure OT Evaluation $Initial OT Evaluation Tier I: 1 Procedure OT Treatments $Self Care/Home Management : 8-22 mins G-Codes:  Pilar Grammes 06/05/2015, 10:05 AM

## 2015-06-05 NOTE — Evaluation (Signed)
Speech Language Pathology Evaluation Patient Details Name: Yvette Guerrero [place] MRN: [id number] DOB: 01/21/1920 Today's Date: 06/05/2015 Time: [id number] SLP Time Calculation (min) (ACUTE ONLY): 27 min  Problem List:  Patient Active Problem List   Diagnosis Date Noted  . Transient cerebral ischemia   . Acute cystitis without hematuria   . CVA (cerebral infarction) 06/03/2015  . Acute kidney injury (HCC) 06/03/2015  . Seizure (HCC) 06/03/2015  . TIA (transient ischemic attack) 06/03/2015  . Hypertension   . Vertigo   . Thyroid disease   . Cerebral infarction due to unspecified mechanism   . Seizure-like activity (HCC)   . Dysarthria   . Left-sided weakness    Past Medical History:  Past Medical History  Diagnosis Date  . Hypertension   . Vertigo   . TIA (transient ischemic attack)   . Elevated cholesterol   . Thyroid disease    Past Surgical History: History reviewed. No pertinent past surgical history. HPI:  Adm 06/03/15 with decr SLP and Lt sided weakness; +witnessed seizure; MRI negatuive acute changes, +old bil occipital CVAs and Rt frontal CVA PMHx-HTN, vertigo, prior CVAs   Assessment / Plan / Recommendation Clinical Impression  Pt has poor awareness of her deficits, which poses a significant safety risk. She has a fluent aphasia with phonemic paraphasias and intermittent neologisms at the conversational level. Simple comprehension appears intact, but she has difficulty with two-step commands and mildly complex yes/no questions. She will benefit from continued SLP f/u to address functional communication and safety.     SLP Assessment  Patient needs continued Speech Lanaguage Pathology Services    Follow Up Recommendations  Skilled Nursing facility;24 hour supervision/assistance    Frequency and Duration min 2x/week  2 weeks      SLP Evaluation Prior Functioning  Cognitive/Linguistic Baseline: Baseline deficits Baseline deficit details: per daughter, requires  assistance with recall and complex problem solving Type of Home: House  Lives With: Daughter;Son Available Help at Discharge: Family;Available 24 hours/day   Cognition  Overall Cognitive Status: Impaired/Different from baseline Arousal/Alertness: Awake/alert Orientation Level: Oriented X4 Attention: Sustained Sustained Attention: Appears intact Memory: Impaired Memory Impairment: Other (comment) (impaired at baseline) Awareness: Impaired Awareness Impairment: Intellectual impairment;Emergent impairment;Anticipatory impairment Behaviors: Impulsive Safety/Judgment: Impaired    Comprehension  Auditory Comprehension Overall Auditory Comprehension: Impaired Yes/No Questions: Impaired Basic Biographical Questions: 76-100% accurate Basic Immediate Environment Questions: 75-100% accurate Complex Questions: 50-74% accurate Commands: Impaired One Step Basic Commands: 75-100% accurate Two Step Basic Commands: 50-74% accurate Conversation: Simple Reading Comprehension Reading Status: Unable to assess (comment) (poor vision)    Expression Expression Primary Mode of Expression: Verbal Verbal Expression Overall Verbal Expression: Impaired Initiation: No impairment Level of Generative/Spontaneous Verbalization: Conversation Naming: Impairment Confrontation: Within functional limits Verbal Errors: Phonemic paraphasias;Neologisms Pragmatics: No impairment   Oral / MudloggerMotor Motor Speech Overall Motor Speech: Appears within functional limits for tasks assessed     Maxcine HamLaura Paiewonsky, M.A. CCC-SLP [phone]  Maxcine Hamaiewonsky, Mathew Storck 06/05/2015, 3:30 PM

## 2015-06-05 NOTE — Progress Notes (Signed)
[facility] PROGRESS NOTE  Yvette BeamMary Guerrero ZOX:[id number] DOB: [birth year] DOA: 06/03/2015 PCP: No primary care provider on file.  Assessment/Plan: 1. Suspected transient ischemic attack -Yvette. Mcree having a history of recurrent TIAs, presented with complaints of dysarthria is associate with left-sided weakness. Initial CT scan of brain was negative. This was followed by an MRI that did not reveal acute intracranial abnormality. -There seems to be some improvement on today's evaluation. Case was discussed with neurology recommending continuing Keppra 500 mg by mouth twice a day as symptoms may have been related to postictal weakness. -Will continue Plavix any 5 mg by mouth daily  2.  Urinary tract infection. -Urine analysis showing the presence of bacteria and nitrates. Family members reporting r mental status changes associated with focal neurological deficits -IV ceftriaxone was stopped, started on Ceftin 250 mg by mouth twice a day.  3.  Possible seizure disorder. -She was started on Keppra 500 mg by mouth twice a day by neurology. -EEG was negative. -Spoke with neurology, symptoms may have been related to postictal weakness  4.  Hypertension. -Per blood pressures are trending up, will restart her Norvasc at 5 mg by mouth daily.  5.  Hypothyroidism. -Continue Synthroid 50 g by mouth daily  6.  Dyslipidemia. -Continue atorvastatin 20 mg by mouth daily   Code Status: Full code Family Communication: Spoke to her daughter who is present at bedside Disposition Plan:  Spoke with family members at bedside, expressed concerns over their ability to manage her care at home. Social work consulted for possible SNF placement.   Consultants:  Neurology  Antibiotics:  Ceftriaxone  HPI/Subjective: Yvette Guerrero is a 81 year old female with a past medical history of hypertension, dyslipidemia, history recurrent TIAs, admitted to the medicine service on 06/03/2015 presented with  complaints of left-sided weakness. Initial workup included a CT scan of brain without contrast that did not reveal acute CVA. Radiology noted old cortical stroke involving occipital lobes right greater than left and the right frontal lobe. She was further worked up with MRI of the brain that did not reveal acute infarct. Suspect symptoms secondary to TIA. Urinalysis did reveal the presence of many bacteria with positive nitrates. It is also conceivable that UTI may have led to mental status changes. There is also question of seizure activity for which she was started on antiepileptic therapy. She was started on empiric IV antibiotic therapy with ceftriaxone.  Objective: Filed Vitals:   06/05/15 1426  BP: 146/60  Pulse: 83  Temp: 98 F (36.7 C)  Resp: 20   No intake or output data in the 24 hours ending 06/05/15 1745 Filed Weights   06/03/15 1004  Weight: 72.576 kg (160 lb)    Exam:   General:  Patient is pleasantly confused, assisted out of bed and was able to ambulate to doorway and back. Remains weak.  Cardiovascular: Regular rate and rhythm normal S1S2  Respiratory: Normal respiratory effort  Abdomen: Soft nontender nondistended  Musculoskeletal: No edema  Neurological: Resolution to dysarthria however has ongoing generalized weakness.   Data Reviewed: Basic Metabolic Panel:  Recent Labs Lab 06/03/15 1000 06/03/15 1025 06/05/15 0404  NA 143 142 139  K 4.2 4.0 3.4*  CL 106 106 105  CO2 25  --  23  GLUCOSE 118* 108* 97  BUN 28* 31* 19  CREATININE 1.39* 1.40* 1.15*  CALCIUM 9.5  --  9.1   Liver Function Tests:  Recent Labs Lab 06/03/15 1000  AST 18  ALT 16  ALKPHOS  57  BILITOT 1.5*  PROT 6.6  ALBUMIN 3.5   No results for input(s): LIPASE, AMYLASE in the last 168 hours. No results for input(s): AMMONIA in the last 168 hours. CBC:  Recent Labs Lab 06/03/15 1000 06/03/15 1025 06/05/15 0404  WBC 7.9  --  11.0*  NEUTROABS 5.3  --   --   HGB 13.8 14.6  12.8  HCT 41.0 43.0 38.9  MCV 99.0  --  98.7  PLT 170  --  149*   Cardiac Enzymes: No results for input(s): CKTOTAL, CKMB, CKMBINDEX, TROPONINI in the last 168 hours. BNP (last 3 results) No results for input(s): BNP in the last 8760 hours.  ProBNP (last 3 results) No results for input(s): PROBNP in the last 8760 hours.  CBG:  Recent Labs Lab 06/03/15 1037  GLUCAP 99    No results found for this or any previous visit (from the past 240 hour(s)).   Studies: Dg Chest 2 View  06/03/2015  CLINICAL DATA:  Patient with history of TIA.  No chest complaints. EXAM: CHEST  2 VIEW COMPARISON:  None. FINDINGS: Monitoring leads overlie the patient. Stable cardiac and mediastinal contours. Low lung volumes. No consolidative pulmonary opacities. No pleural effusion or pneumothorax. Minimal heterogeneous opacities left lung base. IMPRESSION: Low lung volumes with minimal heterogeneous opacities left lung base favored to represent atelectasis. Electronically Signed   By: Annia Belt M.D.   On: 06/03/2015 22:13    Scheduled Meds: . acetaminophen  500 mg Oral BID  . aspirin EC  81 mg Oral Daily  . atorvastatin  20 mg Oral Daily  . cefUROXime  250 mg Oral BID  . clopidogrel  75 mg Oral Daily  . levETIRAcetam  500 mg Oral BID  . levothyroxine  50 mcg Oral QAC breakfast   Continuous Infusions:   Active Problems:   CVA (cerebral infarction)   Hypertension   Vertigo   Thyroid disease   Acute kidney injury (HCC)   Seizure (HCC)   TIA (transient ischemic attack)   Transient cerebral ischemia   Acute cystitis without hematuria    Time spent: 25 min    Jeralyn Bennett  [facility] Pager [phone]. If 7PM-7AM, please contact night-coverage at [email or web address], password [facility] 06/05/2015, 5:45 PM  LOS: 1 day

## 2015-06-06 ENCOUNTER — Inpatient Hospital Stay (HOSPITAL_COMMUNITY): Payer: Medicare Other

## 2015-06-06 LAB — BASIC METABOLIC PANEL
ANION GAP: 9 (ref 5–15)
BUN: 28 mg/dL — ABNORMAL HIGH (ref 6–20)
CALCIUM: 8.8 mg/dL — AB (ref 8.9–10.3)
CO2: 22 mmol/L (ref 22–32)
Chloride: 108 mmol/L (ref 101–111)
Creatinine, Ser: 1.26 mg/dL — ABNORMAL HIGH (ref 0.44–1.00)
GFR calc Af Amer: 41 mL/min — ABNORMAL LOW (ref >60)
GFR, EST NON AFRICAN AMERICAN: 36 mL/min — AB (ref >60)
GLUCOSE: 115 mg/dL — AB (ref 65–99)
POTASSIUM: 4 mmol/L (ref 3.5–5.1)
SODIUM: 139 mmol/L (ref 135–145)

## 2015-06-06 MED ORDER — POLYETHYLENE GLYCOL 3350 17 G PO PACK
17.0000 g | PACK | Freq: Every day | ORAL | Status: DC
  Administered 2015-06-06: 17 g via ORAL
  Filled 2015-06-06: qty 1

## 2015-06-06 MED ORDER — DICLOFENAC SODIUM 1 % TD GEL
2.0000 g | Freq: Four times a day (QID) | TRANSDERMAL | Status: DC
  Administered 2015-06-06 (×3): 2 g via TOPICAL
  Filled 2015-06-06: qty 100

## 2015-06-06 MED ORDER — DICLOFENAC SODIUM 1 % TD GEL: 2.0000 g | Freq: Four times a day (QID) | TRANSDERMAL | Status: AC

## 2015-06-06 MED ORDER — OXYCODONE HCL 5 MG PO TABS: 2.5000 mg | ORAL_TABLET | ORAL | Status: AC | PRN

## 2015-06-06 MED ORDER — ACETAMINOPHEN 500 MG PO TABS: 1000.0000 mg | ORAL_TABLET | Freq: Three times a day (TID) | ORAL | Status: AC

## 2015-06-06 MED ORDER — OXYCODONE HCL 5 MG PO TABS: 2.5000 mg | ORAL_TABLET | ORAL | Status: DC | PRN

## 2015-06-06 MED ORDER — ACETAMINOPHEN 500 MG PO TABS
1000.0000 mg | ORAL_TABLET | Freq: Three times a day (TID) | ORAL | Status: DC
  Administered 2015-06-06 (×2): 1000 mg via ORAL
  Filled 2015-06-06 (×2): qty 2

## 2015-06-06 MED ORDER — CEFUROXIME AXETIL 250 MG PO TABS: 250.0000 mg | ORAL_TABLET | Freq: Two times a day (BID) | ORAL | Status: AC

## 2015-06-06 MED ORDER — SENNOSIDES-DOCUSATE SODIUM 8.6-50 MG PO TABS
1.0000 | ORAL_TABLET | Freq: Two times a day (BID) | ORAL | Status: DC
  Administered 2015-06-06: 1 via ORAL
  Filled 2015-06-06: qty 1

## 2015-06-06 MED ORDER — LEVETIRACETAM 500 MG PO TABS: 500.0000 mg | ORAL_TABLET | Freq: Two times a day (BID) | ORAL | Status: AC

## 2015-06-06 MED ORDER — ONDANSETRON HCL 4 MG/2ML IJ SOLN
4.0000 mg | Freq: Four times a day (QID) | INTRAMUSCULAR | Status: DC | PRN
  Administered 2015-06-06: 4 mg via INTRAVENOUS
  Filled 2015-06-06: qty 2

## 2015-06-06 MED ORDER — BISACODYL 10 MG RE SUPP
10.0000 mg | Freq: Once | RECTAL | Status: AC
  Administered 2015-06-06: 10 mg via RECTAL
  Filled 2015-06-06: qty 1

## 2015-06-06 NOTE — Care Management Important Message (Signed)
Important Message  Patient Details  Name: Yvette Guerrero MRN: [id number] Date of Birth: [birth year]   Medicare Important Message Given:  Yes    [name] [name] 06/06/2015, 2:30 PM

## 2015-06-06 NOTE — Progress Notes (Signed)
Physical Therapy Treatment Patient Details Name: Yvette Guerrero MRN: [id number] DOB: 01/10/1920 Today's Date: 06/06/2015    History of Present Illness Adm 06/03/15 with decr SLP and Lt sided weakness; +witnessed seizure; MRI negatuive acute changes, +old bil occipital CVAs and Rt frontal CVA  PMHx-HTN, vertigo, prior CVAs    PT Comments    Slow progress.  Follow Up Recommendations  SNF     Equipment Recommendations  None recommended by PT    Recommendations for Other Services       Precautions / Restrictions Precautions Precautions: Fall Restrictions Weight Bearing Restrictions: No    Mobility  Bed Mobility Overal bed mobility: Needs Assistance Bed Mobility: Sit to Supine     Supine to sit: Min assist Sit to supine: +2 for physical assistance;Mod assist   General bed mobility comments: Assist to bring legs up into bed and to lower trunk  Transfers Overall transfer level: Needs assistance Equipment used: Ambulation equipment used Transfers: Sit to/from BJ'sStand;Stand Pivot Transfers Sit to Stand: Mod assist;+2 physical assistance Stand pivot transfers: Mod assist;+2 physical assistance       General transfer comment: Assist to bring hip up. Verbal cues to fully extend hips. Stood x 20 sec. Stedy to pivot pt to bed.   Ambulation/Gait             General Gait Details: Attempted to take steps with rollator stepping straight from chair but pt unable due to bil knee pain.   Stairs            Wheelchair Mobility    Modified Rankin (Stroke Patients Only) Modified Rankin (Stroke Patients Only) Pre-Morbid Rankin Score: Moderate disability Modified Rankin: Severe disability     Balance Overall balance assessment: Needs assistance Sitting-balance support: Bilateral upper extremity supported Sitting balance-Leahy Scale: Poor Sitting balance - Comments: Min A to sit EOB Postural control: Posterior lean Standing balance support: Bilateral upper extremity  supported Standing balance-Leahy Scale: Poor Standing balance comment: Stedy and mod A for static standing                    Cognition Arousal/Alertness: Awake/alert Behavior During Therapy: WFL for tasks assessed/performed Overall Cognitive Status: Impaired/Different from baseline       Memory: Decreased short-term memory              Exercises      General Comments        Pertinent Vitals/Pain Pain Assessment: 0-10 Pain Score: 10-Worst pain ever Faces Pain Scale: No hurt Pain Location: bil knees Pain Descriptors / Indicators: Aching;Grimacing Pain Intervention(s): Limited activity within patient's tolerance;Monitored during session;Repositioned    Home Living                      Prior Function            PT Goals (current goals can now be found in the care plan section) Acute Rehab PT Goals Patient Stated Goal: unable to state PT Goal Formulation: With family Time For Goal Achievement: 06/18/15 Potential to Achieve Goals: Fair Progress towards PT goals: Progressing toward goals    Frequency  Min 2X/week    PT Plan Current plan remains appropriate    Co-evaluation             End of Session Equipment Utilized During Treatment: Gait belt Activity Tolerance: Patient limited by pain Patient left: with call bell/phone within reach;in bed;with nursing/sitter in room;with family/visitor present (2 nurse techs cleaning pt; changing linens)  Time: 1131-1140 PT Time Calculation (min) (ACUTE ONLY): 9 min  Charges:  $Therapeutic Activity: 8-22 mins                    G Codes:      Bryn Saline Jun 29, 2015, 11:58 AM Skip Mayer PT [phone]

## 2015-06-06 NOTE — Progress Notes (Signed)
Physical Therapy Treatment Patient Details Name: Yvette Guerrero MRN: [id number] DOB: 10-18-1919 Today's Date: 06/06/2015    History of Present Illness Adm 06/03/15 with decr SLP and Lt sided weakness; +witnessed seizure; MRI negatuive acute changes, +old bil occipital CVAs and Rt frontal CVA  PMHx-HTN, vertigo, prior CVAs    PT Comments    Pt making slow progress. Primarily limited by bil knee pain today.  Follow Up Recommendations  SNF     Equipment Recommendations  None recommended by PT    Recommendations for Other Services       Precautions / Restrictions Precautions Precautions: Fall Restrictions Weight Bearing Restrictions: No    Mobility  Bed Mobility Overal bed mobility: Needs Assistance Bed Mobility: Supine to Sit     Supine to sit: Min assist     General bed mobility comments: Assist to bring hips to EOB  Transfers Overall transfer level: Needs assistance Equipment used: 4-wheeled walker;Ambulation equipment used Transfers: Sit to/from [employer] Sit to Stand: Mod assist;+2 physical assistance Stand pivot transfers: Mod assist;+2 physical assistance       General transfer comment: Stood with rollator but unable to take pivotal steps. Returned to sitting and then used [place] for pivot to chair.  Ambulation/Gait             General Gait Details: Attempted to take steps with rollator stepping straight from chair but pt unable due to bil knee pain.   Stairs            Wheelchair Mobility    Modified Rankin (Stroke Patients Only) Modified Rankin (Stroke Patients Only) Pre-Morbid Rankin Score: Moderate disability Modified Rankin: Severe disability     Balance Overall balance assessment: Needs assistance Sitting-balance support: Single extremity supported;Feet supported Sitting balance-Leahy Scale: Poor Sitting balance - Comments: sits with supervision but uses UE support    Standing balance support: Bilateral upper  extremity supported Standing balance-Leahy Scale: Poor Standing balance comment: rollator and mod a for static standing.                    Cognition Arousal/Alertness: Awake/alert Behavior During Therapy: WFL for tasks assessed/performed Overall Cognitive Status: Impaired/Different from baseline       Memory: Decreased short-term memory              Exercises      General Comments        Pertinent Vitals/Pain Pain Assessment: 0-10 Pain Score: 10-Worst pain ever Faces Pain Scale: No hurt Pain Location: bil knees Pain Descriptors / Indicators: Aching;Grimacing Pain Intervention(s): Limited activity within patient's tolerance;Monitored during session;Patient requesting pain meds-RN notified    Home Living                      Prior Function            PT Goals (current goals can now be found in the care plan section) Acute Rehab PT Goals Patient Stated Goal: unable to state PT Goal Formulation: With family Time For Goal Achievement: 06/18/15 Potential to Achieve Goals: Fair Progress towards PT goals: Progressing toward goals    Frequency  Min 2X/week    PT Plan Current plan remains appropriate;Frequency needs to be updated    Co-evaluation             End of Session Equipment Utilized During Treatment: Gait belt Activity Tolerance: Patient limited by pain Patient left: in chair;with call bell/phone within reach;with chair alarm set (2 nurse techs  cleaning pt; changing linens)     Time: [id number] PT Time Calculation (min) (ACUTE ONLY): 23 min  Charges:  $Therapeutic Activity: 23-37 mins                    G Codes:      Ashante Yellin 06/06/2015, 8:51 AM [facility] Arganbright PT [phone]

## 2015-06-06 NOTE — Care Management Note (Signed)
Case Management Note  Patient Details  Name: Melvenia BeamMary Postel MRN: [id number] Date of Birth: 11-15-1919  Subjective/Objective:                    Action/Plan: Patient was admitted with CVA. Lives at home with daughter. Plan is to discharge to SNF. CSW is following.  Expected Discharge Date:                  Expected Discharge Plan:  Skilled Nursing Facility  In-House Referral:  Clinical Social Work  Discharge planning Services     Post Acute Care Choice:    Choice offered to:  Patient, Adult Children  DME Arranged:    DME Agency:     HH Arranged:    HH Agency:     Status of Service:  Completed, signed off  Medicare Important Message Given:  Yes Date Medicare IM Given:    Medicare IM give by:    Date Additional Medicare IM Given:    Additional Medicare Important Message give by:     If discussed at Long Length of Stay Meetings, dates discussed:    Additional Comments:  Anda KraftRobarge, Nala Kachel C, RN 06/06/2015, 3:32 PM

## 2015-06-06 NOTE — Progress Notes (Signed)
[facility] PROGRESS NOTE  Yvette BeamMary Guerrero [id number] DOB: 1920/03/28 DOA: 06/03/2015 PCP: No primary care provider on file.  Assessment/Plan: 1. Suspected transient ischemic attack -Yvette. Sampath having a history of recurrent TIAs, presented with complaints of dysarthria is associate with left-sided weakness. Initial CT scan of brain was negative. This was followed by an MRI that did not reveal acute intracranial abnormality. -Case was discussed with neurology recommending continuing Keppra 500 mg by mouth twice a day as symptoms may have been related to postictal weakness. -Will continue Plavix 75 mg by mouth daily -On 06/06/2015 she appears stable from a neurologic standpoint.  2.  Urinary tract infection. -Urine analysis showing the presence of bacteria and nitrates. Family members reporting r mental status changes associated with focal neurological deficits -IV ceftriaxone was stopped, started on Ceftin 250 mg by mouth twice a day on 06/05/2015.  3.  Possible seizure disorder. -She was started on Keppra 500 mg by mouth twice a day by neurology. -EEG was negative. -Spoke with neurology, symptoms may have been related to postictal weakness  4.  Hypertension. -Blood pressures are stable, Norvasc 5 mg by mouth daily was restarted on 06/05/2015.  5.  Hypothyroidism. -Continue Synthroid 50 g by mouth daily  6.  Dyslipidemia. -Continue atorvastatin 20 mg by mouth daily  7.  Osteoarthritis. -Patient complaining of significant knee pain bilaterally. I attempted to ambulate her however is appeared to that her knee pain was a significantly impeding her mobility. Will Increase her Tylenol to 1000 mg by mouth 3 times a day scheduled. I have also ordered diclofenac percent gel to be applied to knees bilaterally. Will also provide low-dose oxycodone at 2.5 mg every 4 hours as needed for severe breakthrough pain.    Code Status: Full code Family Communication: Spoke to her daughter who  is present at bedside Disposition Plan:  Spoke with family members at bedside, expressed concerns over their ability to manage her care at home. Social work consulted for possible SNF placement.   Consultants:  Neurology  Antibiotics:  Ceftriaxone stopped on 06/05/2015  Ceftin started on 06/05/2015  HPI/Subjective: Yvette Guerrero is a 81 year old female with a past medical history of hypertension, dyslipidemia, history recurrent TIAs, admitted to the medicine service on 06/03/2015 presented with complaints of left-sided weakness. Initial workup included a CT scan of brain without contrast that did not reveal acute CVA. Radiology noted old cortical stroke involving occipital lobes right greater than left and the right frontal lobe. She was further worked up with MRI of the brain that did not reveal acute infarct. Suspect symptoms secondary to TIA. Urinalysis did reveal the presence of many bacteria with positive nitrates. It is also conceivable that UTI may have led to mental status changes. There is also question of seizure activity for which she was started on antiepileptic therapy. She was started on empiric IV antibiotic therapy with ceftriaxone.  Objective: Filed Vitals:   06/06/15 0910  BP: 134/48  Pulse: 85  Temp: 97.9 F (36.6 C)  Resp: 20   No intake or output data in the 24 hours ending 06/06/15 1408 Filed Weights   06/03/15 1004  Weight: 72.576 kg (160 lb)    Exam:   General:  Patient is pleasantly confused, reports ongoing bilateral knee pain  Cardiovascular: Regular rate and rhythm normal S1S2  Respiratory: Normal respiratory effort  Abdomen: Soft nontender nondistended  Musculoskeletal: No edema, has bilateral lower extremity ecchymosis, knees showing no change.   Neurological: Resolution to dysarthria however has ongoing  generalized weakness.   Data Reviewed: Basic Metabolic Panel:  Recent Labs Lab 06/03/15 1000 06/03/15 1025 06/05/15 0404  06/06/15 0620  NA 143 142 139 139  K 4.2 4.0 3.4* 4.0  CL 106 106 105 108  CO2 25  --  23 22  GLUCOSE 118* 108* 97 115*  BUN 28* 31* 19 28*  CREATININE 1.39* 1.40* 1.15* 1.26*  CALCIUM 9.5  --  9.1 8.8*   Liver Function Tests:  Recent Labs Lab 06/03/15 1000  AST 18  ALT 16  ALKPHOS 57  BILITOT 1.5*  PROT 6.6  ALBUMIN 3.5   No results for input(s): LIPASE, AMYLASE in the last 168 hours. No results for input(s): AMMONIA in the last 168 hours. CBC:  Recent Labs Lab 06/03/15 1000 06/03/15 1025 06/05/15 0404  WBC 7.9  --  11.0*  NEUTROABS 5.3  --   --   HGB 13.8 14.6 12.8  HCT 41.0 43.0 38.9  MCV 99.0  --  98.7  PLT 170  --  149*   Cardiac Enzymes: No results for input(s): CKTOTAL, CKMB, CKMBINDEX, TROPONINI in the last 168 hours. BNP (last 3 results) No results for input(s): BNP in the last 8760 hours.  ProBNP (last 3 results) No results for input(s): PROBNP in the last 8760 hours.  CBG:  Recent Labs Lab 06/03/15 1037  GLUCAP 99    No results found for this or any previous visit (from the past 240 hour(s)).   Studies: No results found.  Scheduled Meds: . acetaminophen  1,000 mg Oral TID  . amLODipine  5 mg Oral Daily  . aspirin EC  81 mg Oral Daily  . atorvastatin  20 mg Oral Daily  . cefUROXime  250 mg Oral BID  . clopidogrel  75 mg Oral Daily  . diclofenac sodium  2 g Topical QID  . levETIRAcetam  500 mg Oral BID  . levothyroxine  50 mcg Oral QAC breakfast   Continuous Infusions:   Active Problems:   CVA (cerebral infarction)   Hypertension   Vertigo   Thyroid disease   Acute kidney injury (HCC)   Seizure (HCC)   TIA (transient ischemic attack)   Transient cerebral ischemia   Acute cystitis without hematuria   Essential hypertension    Time spent: 25 min    Jeralyn Bennett  [facility] Pager [phone]. If 7PM-7AM, please contact night-coverage at [email or web address], password [facility] 06/06/2015, 2:08 PM  LOS: 2 days

## 2015-06-06 NOTE — Clinical Social Work Note (Signed)
Clinical Social Work Assessment  Patient Details  Name: Yvette Guerrero MRN: [id number] Date of Birth: September 25, 1919  Date of referral:  06/06/15               Reason for consult:  Facility Placement in [place]     Housing/Transportation Living arrangements for the past 2 months:  Single Family Home Source of Information:  Patient, Adult Children Patient Interpreter Needed:  None Criminal Activity/Legal Involvement Pertinent to Current Situation/Hospitalization:  No - Comment as needed Significant Relationships:  Adult Children Lives with:  Adult Children Do you feel safe going back to the place where you live?  No Need for family participation in patient care:  Yes (Comment)  Care giving concerns: None    Social Worker assessment / plan: CSW met with the pt and her daughter at the bedside. CSW introduced self and purpose of the visit. CSW discussed SNF rehab. Pt's daughter informed the CSW that the pt lives in [place]. The pt's daughter reported that the pt lives with her and her home address is [address] [place]. The pt did not want me to update the address on the facesheet. CSW explained insurance and its relation to SNF placement.  CSW provided the pt's daughter with a SNF list for [place]. The pt's daughter reported that she would like the pt to go to [facility] or [facility]. CSW explain that it will cost $1,961.04 to transport to [facility] and $1,635.24 to transport to [facility]. CSW explained that payment is required prior to transport.  CSW answered all questions in which the pt/daughter inquired about. CSW will continue to follow this pt and assist with discharge as needed.   Employment status:  Retired Forensic scientist:  [employer] PT Recommendations:  [place] / Referral to community resources:  [place]  Patient/Family's Response to care: The pt reported that she has received positive care.    Patient/Family's Understanding of and Emotional Response to Diagnosis, Current Treatment, and Prognosis: The pt acknowledged that over all her health is not bad. Pt reported feeling fine until she was admitted to the hospital. Pt appeared optimistic about rehab and retuning back home.   Emotional Assessment Appearance:  Appears stated age Attitude/Demeanor/Rapport:   (Pleasant ) Affect (typically observed):  Accepting, Appropriate Orientation:  Oriented to Self, Oriented to Place, Oriented to Situation, Oriented to  Time Alcohol / Substance use:  Not Applicable Psych involvement (Current and /or in the community):  No (Comment)  Discharge Needs  Concerns to be addressed:  Denies Needs/Concerns at this time Readmission within the last 30 days:  No Current discharge risk:  None Barriers to Discharge:  No Barriers Identified   Terion Hedman, LCSW 06/06/2015, 8:27 AM

## 2015-06-06 NOTE — Discharge Summary (Addendum)
Physician Discharge Summary  Yvette Guerrero ZOX:[id number] DOB: 12-Nov-1919 DOA: 06/03/2015  PCP: No primary care Yvette Guerrero on file.  Admit date: 06/03/2015 Discharge date: 06/07/2015  Time spent: 35 minutes  Recommendations for Outpatient Follow-up:  1. Please follow up on BMP and CBC in 4-5 days, during this hospitalization had acute kidney failure.  2. Recommend Ceftin 250 mg PO BID x 3 days then stop 3. Patient was discharged to skilled nursing facility  Discharge Diagnoses:  Active Problems:   CVA (cerebral infarction)   Hypertension   Vertigo   Thyroid disease   Acute kidney injury (HCC)   Seizure (HCC)   TIA (transient ischemic attack)   Transient cerebral ischemia   Acute cystitis without hematuria   Essential hypertension   Discharge Condition: Stable  Diet recommendation: Heart healthy  Filed Weights   06/03/15 1004  Weight: 72.576 kg (160 lb)    History of present illness:  Yvette Guerrero is a 81 y.o. female the past medical history that includes hypertension, hyperlipidemia, TIA, thyroid disease presents to the emergency department with the chief complaint of sudden onset of left-sided weakness dysarthria. Initial evaluation in the emergency department includes neurology consult who recommended admission for stroke workup. Information is obtained from the granddaughter who is at the bedside. Patient is from [place] lives with her daughter came here last night for visit with the granddaughter. Her baseline is very functional fairly independent. Granddaughter reports she noticed this morning when the patient awakened around 7:45 AM she had slurred speech and obvious weakness of the left arm and leg. She was unable to bear weight. She noticed slight facial droop. He should have gone to bed at 8 PM the night before and had no symptoms. EMS was called transported to the emergency department during her presentation in the ED jerking movements of left arm and leg noted. There was  no complaints of chest pain palpitation shortness of breath headache dizziness syncope or near-syncope. No reports of any visual disturbances. Patient went to bed normal and awakened with these symptoms.   Workup in the emergency department lids comprehensive metabolic panel with a creatinine of 1.39 otherwise unremarkable, CBC that is unremarkable troponin negative. CT of the head no acute intracranial abnormality. During neurology evaluation some question regarding seizure activity. She was given 1 mg of Ativan IV as well as 1 g of Dilantin IV due to concern for left focal motor seizure.  Hospital Course:  Yvette Guerrero is a 81 year old female with a past medical history of hypertension, dyslipidemia, history recurrent TIAs, admitted to the medicine service on 06/03/2015 presented with complaints of left-sided weakness. Initial workup included a CT scan of brain without contrast that did not reveal acute CVA. Radiology noted old cortical stroke involving occipital lobes right greater than left and the right frontal lobe. She was further worked up with MRI of the brain that did not reveal acute infarct. Suspect symptoms secondary to TIA. Urinalysis did reveal the presence of many bacteria with positive nitrates. It is also conceivable that UTI may have led to mental status changes. There is also question of seizure activity for which she was started on antiepileptic therapy.    Suspected transient ischemic attack -Yvette Guerrero having a history of recurrent TIAs, presented with complaints of dysarthria is associate with left-sided weakness. Initial CT scan of brain was negative. This was followed by an MRI that did not reveal acute intracranial abnormality. -Case was discussed with neurology though this may bave been related to TIA,  seizure disorder is also a possibility, thus  recommending continuing Keppra 500 mg by mouth twice a day.  -Will continue Plavix 75 mg by mouth daily -On 06/06/2015 she appears  stable from a neurologic standpoint.  2. Urinary tract infection. -Urine analysis showing the presence of bacteria and nitrates. Family members reporting r mental status changes associated with focal neurological deficits -IV ceftriaxone was stopped, started on Ceftin 250 mg by mouth twice a day on 06/05/2015.  3. Possible seizure disorder. -She was started on Keppra 500 mg by mouth twice a day by neurology. -EEG was negative. -Spoke with neurology, symptoms may have been related to postictal weakness  4. Hypertension. -Blood pressures are stable, Norvasc 5 mg by mouth daily was restarted on 06/05/2015.  5. Hypothyroidism. -Continue Synthroid 50 g by mouth daily  6. Dyslipidemia. -Continue atorvastatin 20 mg by mouth daily  7. Osteoarthritis. -Patient complaining of significant knee pain bilaterally. I attempted to ambulate her however is appeared to that her knee pain was a significantly impeding her mobility. Will Increase her Tylenol to 1000 mg by mouth 3 times a day scheduled. I have also ordered diclofenac percent gel to be applied to knees bilaterally. Will also provide low-dose oxycodone at 2.5 mg every 4 hours as needed for severe breakthrough pain.    Addendum  I personally evaluated patient on 06/07/2015. This morning she reports feeling better having significant improvement to bilateral lower extrwemity knee pain. She had a bowel movement overnight. Spoke with family updated them. I feel she is stable to transport to SNF in [place].   Consultations:  Neurology  Discharge Exam: Filed Vitals:   06/06/15 0910  BP: 134/48  Pulse: 85  Temp: 97.9 F (36.6 C)  Resp: 20     General: Patient is pleasantly confused, reports ongoing bilateral knee pain  Cardiovascular: Regular rate and rhythm normal S1S2  Respiratory: Normal respiratory effort  Abdomen: Soft nontender nondistended  Musculoskeletal: No edema, has bilateral lower extremity ecchymosis, knees  showing no change.   Neurological: Resolution to dysarthria however has ongoing generalized weakness.  Discharge Instructions    Current Discharge Medication List    START taking these medications   Details  cefUROXime (CEFTIN) 250 MG tablet Take 1 tablet (250 mg total) by mouth 2 (two) times daily with a meal. Qty: 8 tablet, Refills: 0    diclofenac sodium (VOLTAREN) 1 % GEL Apply 2 g topically 4 (four) times daily. Qty: 1 Tube, Refills: 1    levETIRAcetam (KEPPRA) 500 MG tablet Take 1 tablet (500 mg total) by mouth 2 (two) times daily. Qty: 60 tablet, Refills: 0    oxyCODONE (OXY IR/ROXICODONE) 5 MG immediate release tablet Take 0.5 tablets (2.5 mg total) by mouth every 4 (four) hours as needed for severe pain. Qty: 15 tablet, Refills: 0      CONTINUE these medications which have CHANGED   Details  acetaminophen (TYLENOL) 500 MG tablet Take 2 tablets (1,000 mg total) by mouth 3 (three) times daily. Qty: 30 tablet, Refills: 0      CONTINUE these medications which have NOT CHANGED   Details  amLODipine (NORVASC) 5 MG tablet Take 5 mg by mouth daily.    aspirin EC 81 MG tablet Take 81 mg by mouth daily.    atorvastatin (LIPITOR) 20 MG tablet Take 20 mg by mouth daily.    clopidogrel (PLAVIX) 75 MG tablet Take 75 mg by mouth daily.    levothyroxine (SYNTHROID, LEVOTHROID) 50 MCG tablet Take 50 mcg  by mouth daily before breakfast.    meclizine (ANTIVERT) 25 MG tablet Take 25 mg by mouth 3 (three) times daily as needed for dizziness.    Vitamin D, Cholecalciferol, 1000 UNITS TABS Take 1 tablet by mouth daily.       STOP taking these medications     atenolol (TENORMIN) 50 MG tablet      Calcium Carbonate (CALCIUM 600 PO)      omega-3 acid ethyl esters (LOVAZA) 1 G capsule        Allergies  Allergen Reactions  . Penicillins     Has patient had a PCN reaction causing immediate rash, facial/tongue/throat swelling, SOB or lightheadedness with hypotension: No Has  patient had a PCN reaction causing severe rash involving mucus membranes or skin necrosis: No Has patient had a PCN reaction that required hospitalization  Has patient had a PCN reaction occurring within the last 10 years:  If all of the above answers are "NO", then may proceed with Cephalosporin use.      The results of significant diagnostics from this hospitalization (including imaging, microbiology, ancillary and laboratory) are listed below for reference.    Significant Diagnostic Studies: Dg Chest 2 View  06/03/2015  CLINICAL DATA:  Patient with history of TIA.  No chest complaints. EXAM: CHEST  2 VIEW COMPARISON:  None. FINDINGS: Monitoring leads overlie the patient. Stable cardiac and mediastinal contours. Low lung volumes. No consolidative pulmonary opacities. No pleural effusion or pneumothorax. Minimal heterogeneous opacities left lung base. IMPRESSION: Low lung volumes with minimal heterogeneous opacities left lung base favored to represent atelectasis. Electronically Signed   By: Annia Belt M.D.   On: 06/03/2015 22:13   Ct Head Wo Contrast  06/03/2015  CLINICAL DATA:  81 year old with prior strokes who presents with acute onset of left upper and lower extremity weakness and slurred speech witnessed by her granddaughter earlier today. EXAM: CT HEAD WITHOUT CONTRAST TECHNIQUE: Contiguous axial images were obtained from the base of the skull through the vertex without intravenous contrast. COMPARISON:  None. FINDINGS: Encephalomalacia involving both occipital lobes, right greater than left, and the right frontal lobe. Severe changes of small vessel disease of the white matter, particularly in the basal ganglia bilaterally and in the periventricular white matter. Physiologic calcifications in the basal ganglia. Ventricular system normal in size and appearance for age. Moderate generalized age related atrophy. No mass lesion. No midline shift. No acute hemorrhage or hematoma. No extra-axial  fluid collections. No evidence of acute infarction. No skull fracture or other focal osseous abnormality involving the skull. Visualized paranasal sinuses, bilateral mastoid air cells and bilateral middle ear cavities well-aerated. Extensive bilateral carotid siphon and vertebral artery atherosclerosis. IMPRESSION: 1. No acute intracranial abnormality. 2. Old cortical stroke involving both occipital lobes (right greater than left) and the right frontal lobe. 3. Moderate generalized age related atrophy and severe chronic microvascular ischemic changes of the white matter. Electronically Signed   By: Hulan Saas M.D.   On: 06/03/2015 11:58   Mr Yvette Guerrero Wo Contrast  06/03/2015  CLINICAL DATA:  81 year old hypertensive female with left-sided weakness, dysarthria and left focal seizure. Subsequent encounter. EXAM: MRI HEAD WITHOUT CONTRAST MRA HEAD WITHOUT CONTRAST TECHNIQUE: Multiplanar, multiecho pulse sequences of the brain and surrounding structures were obtained without intravenous contrast. Angiographic images of the head were obtained using MRA technique without contrast. COMPARISON:  06/03/2015 head CT.  No comparison brain MR. FINDINGS: MRI HEAD FINDINGS No acute infarct. Remote bilateral occipital lobe infarcts and right  frontal lobe infarct with encephalomalacia. No intracranial hemorrhage. Global atrophy without hydrocephalus. No intracranial mass lesion noted on this unenhanced exam. Mild spinal stenosis C3-4. Cervical medullary junction, pituitary region, pineal region and orbital structures unremarkable. Post lens replacement. MRA HEAD FINDINGS Anterior circulation without medium or large size vessel significant stenosis or occlusion. Ectatic vertebral arteries with right vertebral artery dominant in size without high-grade stenosis. Ectatic basilar artery without high-grade stenosis. Branch vessel narrowing and irregularity most notable involving the left posterior cerebellar artery. No aneurysm  IMPRESSION: MRI HEAD No acute infarct. Remote bilateral occipital lobe infarcts and right frontal lobe infarct with encephalomalacia. Global atrophy without hydrocephalus. No intracranial mass lesion noted on this unenhanced exam. MRA HEAD Anterior circulation without medium or large size vessel significant stenosis or occlusion. Ectatic vertebral arteries with right vertebral artery dominant in size without high-grade stenosis. Ectatic basilar artery without high-grade stenosis. Branch vessel narrowing and irregularity most notable involving the left posterior cerebellar artery. Electronically Signed   By: Lacy Duverney M.D.   On: 06/03/2015 17:36   Mr Brain Wo Contrast  06/03/2015  CLINICAL DATA:  81 year old hypertensive female with left-sided weakness, dysarthria and left focal seizure. Subsequent encounter. EXAM: MRI HEAD WITHOUT CONTRAST MRA HEAD WITHOUT CONTRAST TECHNIQUE: Multiplanar, multiecho pulse sequences of the brain and surrounding structures were obtained without intravenous contrast. Angiographic images of the head were obtained using MRA technique without contrast. COMPARISON:  06/03/2015 head CT.  No comparison brain MR. FINDINGS: MRI HEAD FINDINGS No acute infarct. Remote bilateral occipital lobe infarcts and right frontal lobe infarct with encephalomalacia. No intracranial hemorrhage. Global atrophy without hydrocephalus. No intracranial mass lesion noted on this unenhanced exam. Mild spinal stenosis C3-4. Cervical medullary junction, pituitary region, pineal region and orbital structures unremarkable. Post lens replacement. MRA HEAD FINDINGS Anterior circulation without medium or large size vessel significant stenosis or occlusion. Ectatic vertebral arteries with right vertebral artery dominant in size without high-grade stenosis. Ectatic basilar artery without high-grade stenosis. Branch vessel narrowing and irregularity most notable involving the left posterior cerebellar artery. No  aneurysm IMPRESSION: MRI HEAD No acute infarct. Remote bilateral occipital lobe infarcts and right frontal lobe infarct with encephalomalacia. Global atrophy without hydrocephalus. No intracranial mass lesion noted on this unenhanced exam. MRA HEAD Anterior circulation without medium or large size vessel significant stenosis or occlusion. Ectatic vertebral arteries with right vertebral artery dominant in size without high-grade stenosis. Ectatic basilar artery without high-grade stenosis. Branch vessel narrowing and irregularity most notable involving the left posterior cerebellar artery. Electronically Signed   By: Lacy Duverney M.D.   On: 06/03/2015 17:36    Microbiology: No results found for this or any previous visit (from the past 240 hour(s)).   Labs: Basic Metabolic Panel:  Recent Labs Lab 06/03/15 1000 06/03/15 1025 06/05/15 0404 06/06/15 0620  NA 143 142 139 139  K 4.2 4.0 3.4* 4.0  CL 106 106 105 108  CO2 25  --  23 22  GLUCOSE 118* 108* 97 115*  BUN 28* 31* 19 28*  CREATININE 1.39* 1.40* 1.15* 1.26*  CALCIUM 9.5  --  9.1 8.8*   Liver Function Tests:  Recent Labs Lab 06/03/15 1000  AST 18  ALT 16  ALKPHOS 57  BILITOT 1.5*  PROT 6.6  ALBUMIN 3.5   No results for input(s): LIPASE, AMYLASE in the last 168 hours. No results for input(s): AMMONIA in the last 168 hours. CBC:  Recent Labs Lab 06/03/15 1000 06/03/15 1025 06/05/15 0404  WBC 7.9  --  11.0*  NEUTROABS 5.3  --   --   HGB 13.8 14.6 12.8  HCT 41.0 43.0 38.9  MCV 99.0  --  98.7  PLT 170  --  149*   Cardiac Enzymes: No results for input(s): CKTOTAL, CKMB, CKMBINDEX, TROPONINI in the last 168 hours. BNP: BNP (last 3 results) No results for input(s): BNP in the last 8760 hours.  ProBNP (last 3 results) No results for input(s): PROBNP in the last 8760 hours.  CBG:  Recent Labs Lab 06/03/15 1037  GLUCAP 99       Signed:  ZAMORA, EZEQUIEL  [facility] 06/06/2015, 2:16 PM

## 2015-06-07 LAB — BASIC METABOLIC PANEL
ANION GAP: 9 (ref 5–15)
BUN: 26 mg/dL — ABNORMAL HIGH (ref 6–20)
CALCIUM: 8.6 mg/dL — AB (ref 8.9–10.3)
CO2: 22 mmol/L (ref 22–32)
Chloride: 106 mmol/L (ref 101–111)
Creatinine, Ser: 1.29 mg/dL — ABNORMAL HIGH (ref 0.44–1.00)
GFR calc non Af Amer: 35 mL/min — ABNORMAL LOW (ref >60)
GFR, EST AFRICAN AMERICAN: 40 mL/min — AB (ref >60)
Glucose, Bld: 104 mg/dL — ABNORMAL HIGH (ref 65–99)
Potassium: 3.8 mmol/L (ref 3.5–5.1)
SODIUM: 137 mmol/L (ref 135–145)

## 2015-06-07 LAB — CBC
HCT: 37.1 % (ref 36.0–46.0)
HEMOGLOBIN: 12.2 g/dL (ref 12.0–15.0)
MCH: 32.2 pg (ref 26.0–34.0)
MCHC: 32.9 g/dL (ref 30.0–36.0)
MCV: 97.9 fL (ref 78.0–100.0)
PLATELETS: 136 10*3/uL — AB (ref 150–400)
RBC: 3.79 MIL/uL — AB (ref 3.87–5.11)
RDW: 13.8 % (ref 11.5–15.5)
WBC: 9 10*3/uL (ref 4.0–10.5)

## 2015-06-07 NOTE — Progress Notes (Signed)
Patient DCd to [facility], who are unable to take report at this time. Patient will be transported via ambulance.

## 2015-06-07 NOTE — Progress Notes (Signed)
[id number]- [facility]. They are requesting fax of DC summary and prescription. [phone]- Secretary faxed DC summary and prescription for oxycodone 5mg . 1050- Recd call from [facility] stating they have not recd DC summary or prescription. 1100- SW notified of requests by [facility]. SW advised Clinical research associatewriter to give SW phone number for any additional needs.

## 2017-05-11 IMAGING — CT CT HEAD W/O CM
2 series · 15 of 30 positions shown, 19 images · non-contrast
Comparison: None.

CLINICAL DATA: [AGE] with prior strokes who presents with
acute onset of left upper and lower extremity weakness and slurred
speech witnessed by her granddaughter earlier today.

EXAM:
CT HEAD WITHOUT CONTRAST
TECHNIQUE: Contiguous axial images were obtained from the base of the skull
through the vertex without intravenous contrast.

[Series 201: head w/o, idose (1) · axial · non-contrast · 0.38mm/px · z∈[+1096,+1216]mm · 13 of 29 slices shown, 17 images]
[im 3/29  brain]
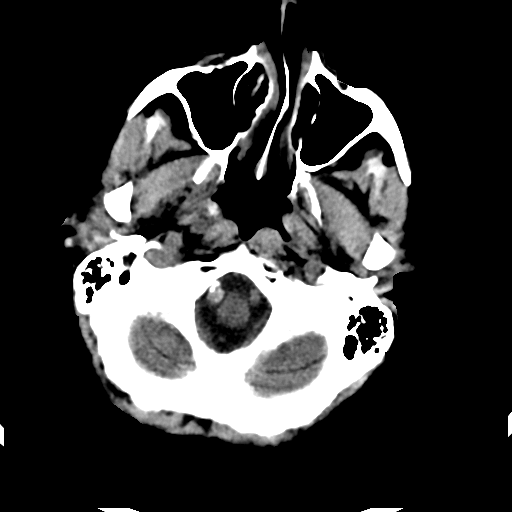
[im 3/29  bone]
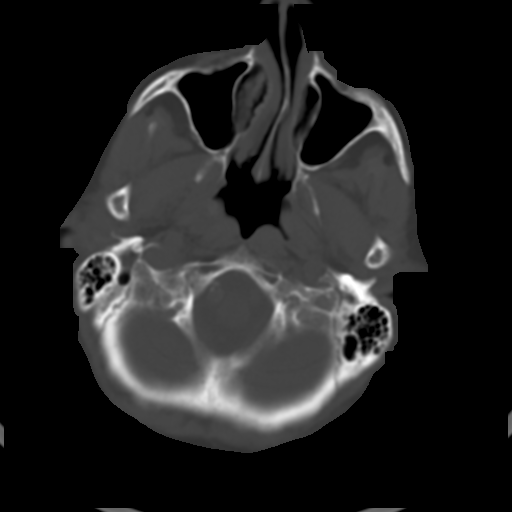
[im 5/29  brain]
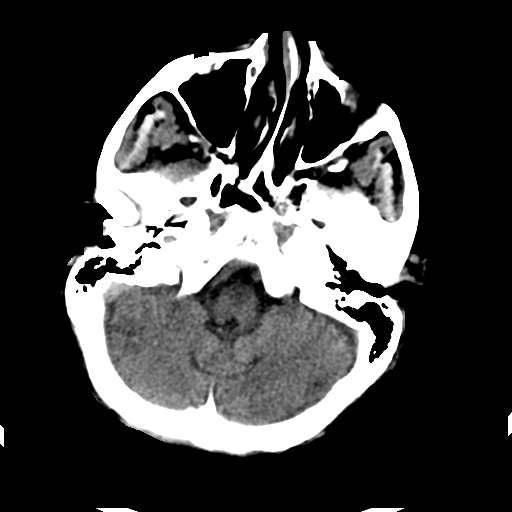
[im 7/29  brain]
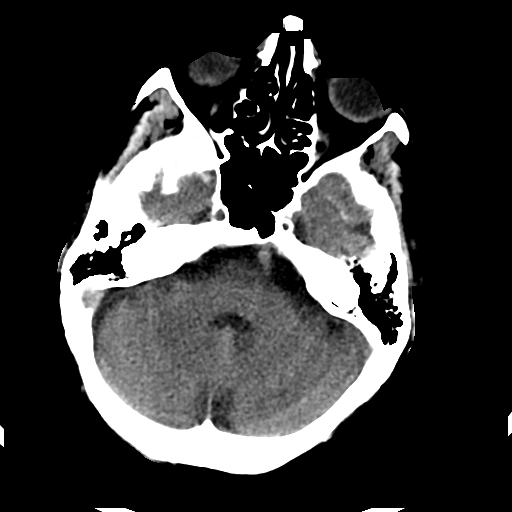
[im 9/29  brain]
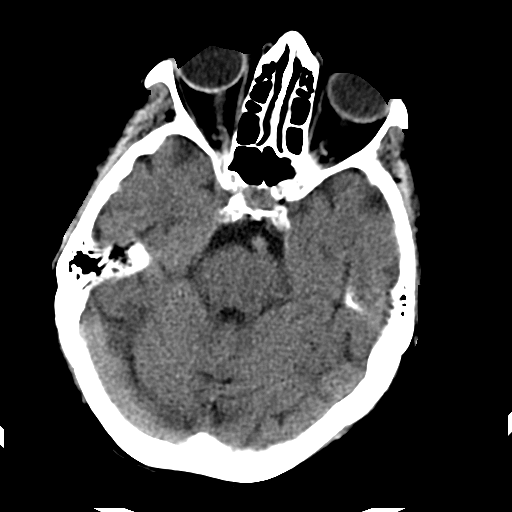
[im 11/29  brain]
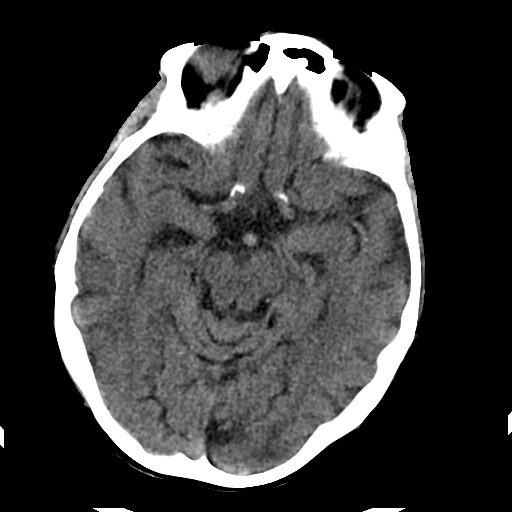
[im 11/29  bone]
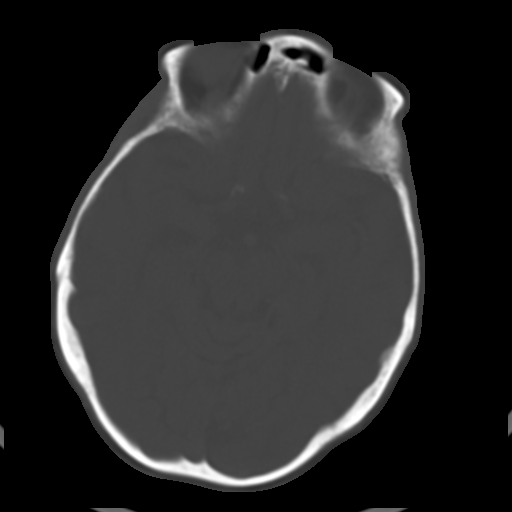
[im 13/29  brain]
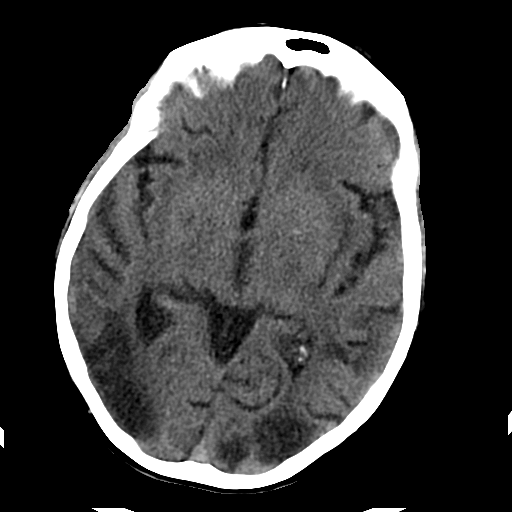
[im 15/29  brain]
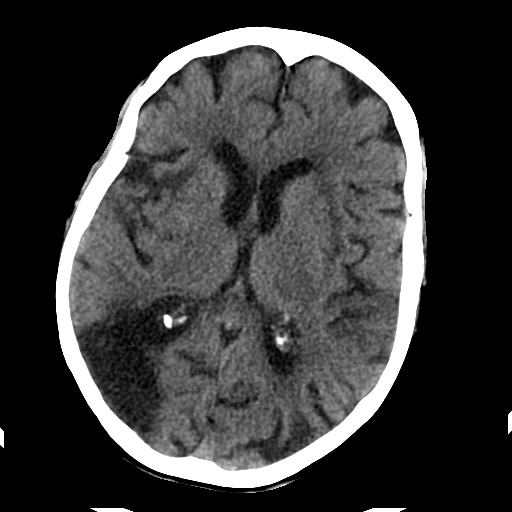
[im 17/29  brain]
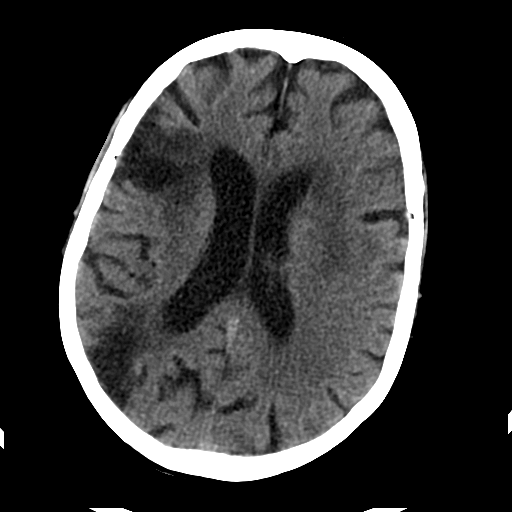
[im 19/29  brain]
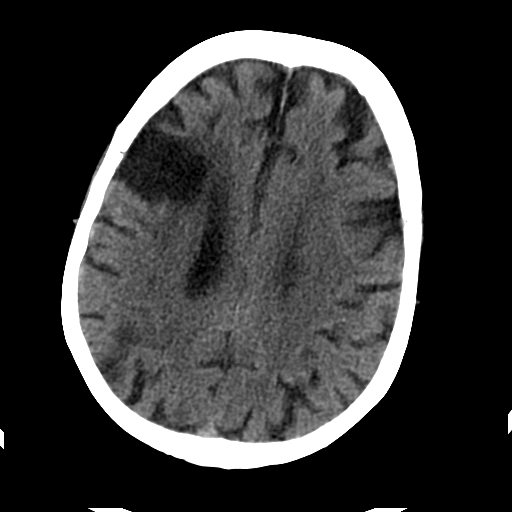
[im 19/29  bone]
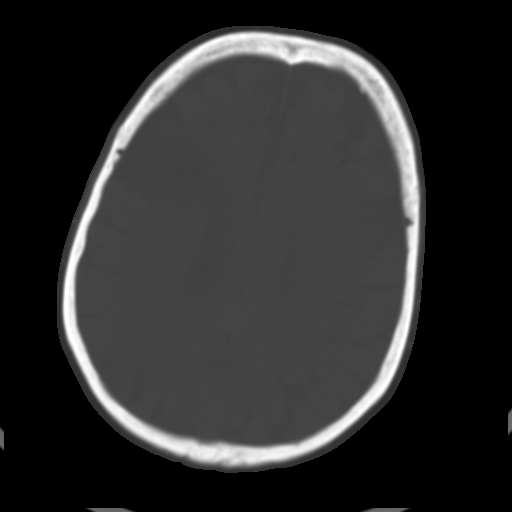
[im 21/29  brain]
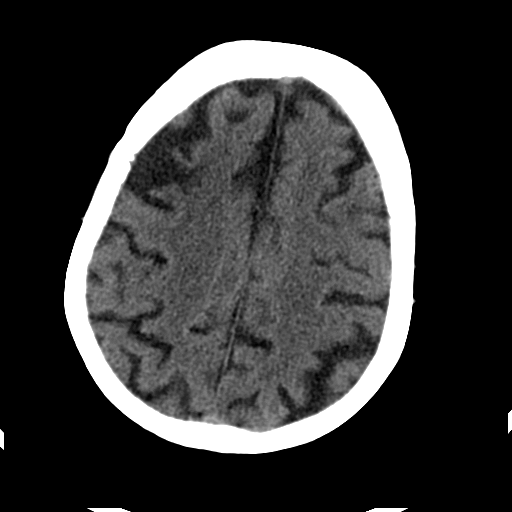
[im 23/29  brain]
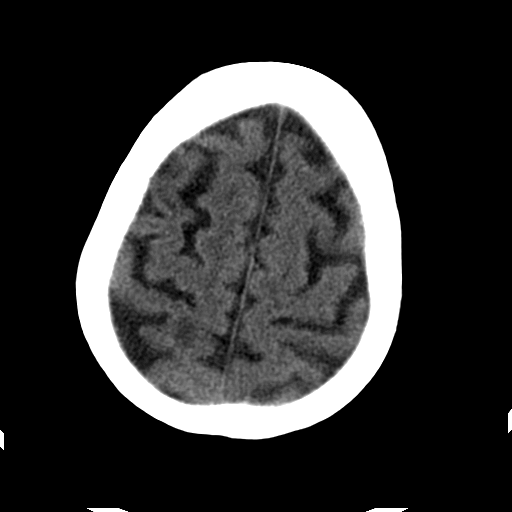
[im 25/29  brain]
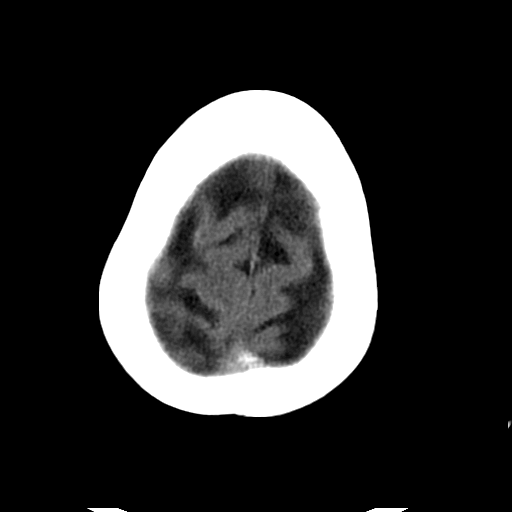
[im 27/29  brain]
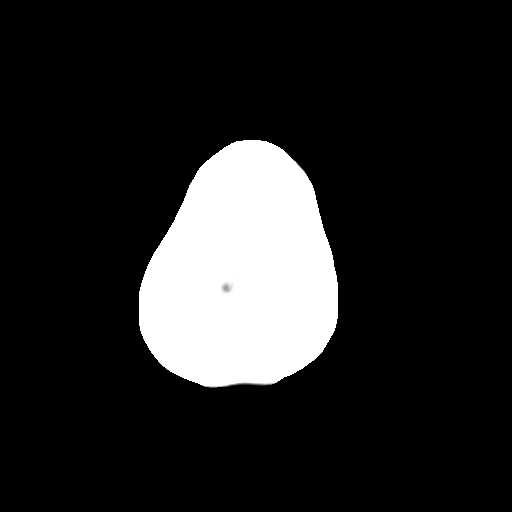
[im 27/29  bone]
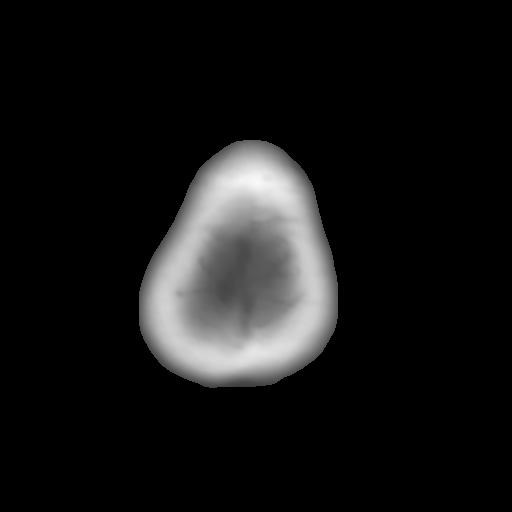

[Series 202: head w/o bone, idose (1) · axial · non-contrast · 0.38mm/px · z∈[+1096,+1116]mm · 2 of 30 slices shown]
[im 3/30  bone]
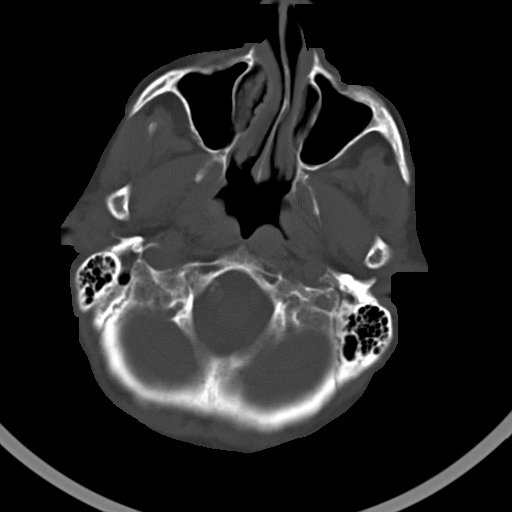
[im 7/30  bone]
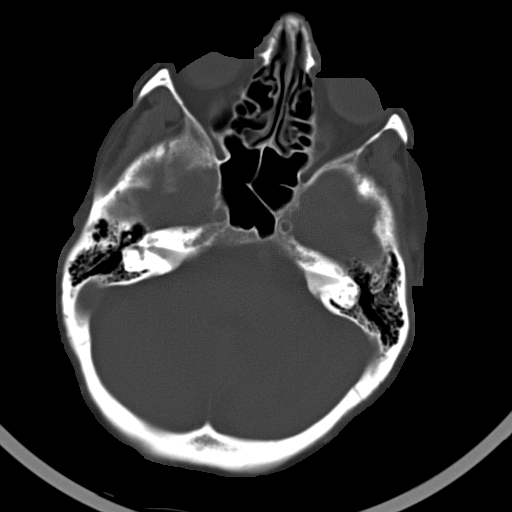

[15 of 30 positions shown; findings below may reference images not displayed]

FINDINGS: Encephalomalacia involving both occipital lobes, right greater than
left, and the right frontal lobe. Severe changes of small vessel
disease of the white matter, particularly in the basal ganglia
bilaterally and in the periventricular white matter. Physiologic
calcifications in the basal ganglia. Ventricular system normal in
size and appearance for age. Moderate generalized age related
atrophy. No mass lesion. No midline shift. No acute hemorrhage or
hematoma. No extra-axial fluid collections. No evidence of acute
infarction.

No skull fracture or other focal osseous abnormality involving the
skull. Visualized paranasal sinuses, bilateral mastoid air cells and
bilateral middle ear cavities well-aerated. Extensive bilateral
carotid siphon and vertebral artery atherosclerosis.
IMPRESSION: 1. No acute intracranial abnormality.
2. Old cortical stroke involving both occipital lobes (right greater
than left) and the right frontal lobe.
3. Moderate generalized age related atrophy and severe chronic
microvascular ischemic changes of the white matter.

## 2020-06-25 DEATH — deceased
